# Patient Record
Sex: Female | Born: 1995 | Race: Black or African American | Hispanic: No | Marital: Single | Smoking: Never smoker
Health system: Southern US, Community
[De-identification: ages and names within clinical notes are randomized; demographics above are authoritative.]

## PROBLEM LIST (undated history)

## (undated) DIAGNOSIS — T7840XA Allergy, unspecified, initial encounter: Secondary | ICD-10-CM

## (undated) HISTORY — PX: WISDOM TOOTH EXTRACTION: SHX21

## (undated) HISTORY — DX: Allergy, unspecified, initial encounter: T78.40XA

---

## 2015-11-23 ENCOUNTER — Ambulatory Visit (INDEPENDENT_AMBULATORY_CARE_PROVIDER_SITE_OTHER): Payer: BLUE CROSS/BLUE SHIELD | Admitting: Family Medicine

## 2015-11-23 ENCOUNTER — Ambulatory Visit (INDEPENDENT_AMBULATORY_CARE_PROVIDER_SITE_OTHER): Payer: BLUE CROSS/BLUE SHIELD

## 2015-11-23 VITALS — BP 122/80 | HR 100 | Temp 99.2°F | Resp 16 | Ht 64.0 in | Wt 207.4 lb

## 2015-11-23 DIAGNOSIS — M545 Low back pain, unspecified: Secondary | ICD-10-CM

## 2015-11-23 DIAGNOSIS — M542 Cervicalgia: Secondary | ICD-10-CM | POA: Diagnosis not present

## 2015-11-23 DIAGNOSIS — S161XXA Strain of muscle, fascia and tendon at neck level, initial encounter: Secondary | ICD-10-CM

## 2015-11-23 DIAGNOSIS — G44319 Acute post-traumatic headache, not intractable: Secondary | ICD-10-CM

## 2015-11-23 DIAGNOSIS — S39012A Strain of muscle, fascia and tendon of lower back, initial encounter: Secondary | ICD-10-CM

## 2015-11-23 MED ORDER — METHOCARBAMOL 500 MG PO TABS: 500.0000 mg | ORAL_TABLET | Freq: Four times a day (QID) | ORAL | Status: AC | PRN

## 2015-11-23 MED ORDER — IBUPROFEN 600 MG PO TABS: 600.0000 mg | ORAL_TABLET | Freq: Four times a day (QID) | ORAL | Status: AC | PRN

## 2015-11-23 NOTE — Patient Instructions (Signed)
Cervical Sprain  A cervical sprain is an injury in the neck in which the strong, fibrous tissues (ligaments) that connect your neck bones stretch or tear. Cervical sprains can range from mild to severe. Severe cervical sprains can cause the neck vertebrae to be unstable. This can lead to damage of the spinal cord and can result in serious nervous system problems. The amount of time it takes for a cervical sprain to get better depends on the cause and extent of the injury. Most cervical sprains heal in 1 to 3 weeks.  CAUSES   Severe cervical sprains may be caused by:    Contact sport injuries (such as from football, rugby, wrestling, hockey, auto racing, gymnastics, diving, martial arts, or boxing).    Motor vehicle collisions.    Whiplash injuries. This is an injury from a sudden forward and backward whipping movement of the head and neck.   Falls.   Mild cervical sprains may be caused by:    Being in an awkward position, such as while cradling a telephone between your ear and shoulder.    Sitting in a chair that does not offer proper support.    Working at a poorly designed computer station.    Looking up or down for long periods of time.   SYMPTOMS    Pain, soreness, stiffness, or a burning sensation in the front, back, or sides of the neck. This discomfort may develop immediately after the injury or slowly, 24 hours or more after the injury.    Pain or tenderness directly in the middle of the back of the neck.    Shoulder or upper back pain.    Limited ability to move the neck.    Headache.    Dizziness.    Weakness, numbness, or tingling in the hands or arms.    Muscle spasms.    Difficulty swallowing or chewing.    Tenderness and swelling of the neck.   DIAGNOSIS   Most of the time your health care provider can diagnose a cervical sprain by taking your history and doing a physical exam. Your health care provider will ask about previous neck injuries and any known neck  problems, such as arthritis in the neck. X-rays may be taken to find out if there are any other problems, such as with the bones of the neck. Other tests, such as a CT scan or MRI, may also be needed.   TREATMENT   Treatment depends on the severity of the cervical sprain. Mild sprains can be treated with rest, keeping the neck in place (immobilization), and pain medicines. Severe cervical sprains are immediately immobilized. Further treatment is done to help with pain, muscle spasms, and other symptoms and may include:   Medicines, such as pain relievers, numbing medicines, or muscle relaxants.    Physical therapy. This may involve stretching exercises, strengthening exercises, and posture training. Exercises and improved posture can help stabilize the neck, strengthen muscles, and help stop symptoms from returning.   HOME CARE INSTRUCTIONS    Put ice on the injured area.     Put ice in a plastic bag.     Place a towel between your skin and the bag.     Leave the ice on for 15-20 minutes, 3-4 times a day.    If your injury was severe, you may have been given a cervical collar to wear. A cervical collar is a two-piece collar designed to keep your neck from moving while it heals.      Do not remove the collar unless instructed by your health care provider.    If you have long hair, keep it outside of the collar.    Ask your health care provider before making any adjustments to your collar. Minor adjustments may be required over time to improve comfort and reduce pressure on your chin or on the back of your head.    Ifyou are allowed to remove the collar for cleaning or bathing, follow your health care provider's instructions on how to do so safely.    Keep your collar clean by wiping it with mild soap and water and drying it completely. If the collar you have been given includes removable pads, remove them every 1-2 days and hand wash them with soap and water. Allow them to air dry. They should be completely  dry before you wear them in the collar.    If you are allowed to remove the collar for cleaning and bathing, wash and dry the skin of your neck. Check your skin for irritation or sores. If you see any, tell your health care provider.    Do not drive while wearing the collar.    Only take over-the-counter or prescription medicines for pain, discomfort, or fever as directed by your health care provider.    Keep all follow-up appointments as directed by your health care provider.    Keep all physical therapy appointments as directed by your health care provider.    Make any needed adjustments to your workstation to promote good posture.    Avoid positions and activities that make your symptoms worse.    Warm up and stretch before being active to help prevent problems.   SEEK MEDICAL CARE IF:    Your pain is not controlled with medicine.    You are unable to decrease your pain medicine over time as planned.    Your activity level is not improving as expected.   SEEK IMMEDIATE MEDICAL CARE IF:    You develop any bleeding.   You develop stomach upset.   You have signs of an allergic reaction to your medicine.    Your symptoms get worse.    You develop new, unexplained symptoms.    You have numbness, tingling, weakness, or paralysis in any part of your body.   MAKE SURE YOU:    Understand these instructions.   Will watch your condition.   Will get help right away if you are not doing well or get worse.     This information is not intended to replace advice given to you by your health care provider. Make sure you discuss any questions you have with your health care provider.     Document Released: 10/07/2007 Document Revised: 12/15/2013 Document Reviewed: 06/17/2013  [employer]vier Interactive Patient Education 2016 [employer]
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
## 2015-11-29 ENCOUNTER — Telehealth: Payer: Self-pay

## 2015-11-29 DIAGNOSIS — S161XXD Strain of muscle, fascia and tendon at neck level, subsequent encounter: Secondary | ICD-10-CM

## 2015-11-29 DIAGNOSIS — S39012D Strain of muscle, fascia and tendon of lower back, subsequent encounter: Secondary | ICD-10-CM

## 2015-11-29 NOTE — Telephone Encounter (Signed)
SMITH - Pt would like discuss with you her need for physical therapy.  [phone]

## 2015-11-30 NOTE — Telephone Encounter (Signed)
Call --- 1. Placed referral for physical therapy.  2.  Please get update --- how is neck pain and how is lower back pain and how is headache?  Is she better/worse/unchanged?  Any new symptoms?

## 2015-11-30 NOTE — Telephone Encounter (Signed)
Attempted to call patient back. Patient did not answer the phone and her voicemail was full. Will call back later.

## 2015-11-30 NOTE — Telephone Encounter (Signed)
Patient called back. She stated that she would like to start physical therapy as mentioned during her last office visit. If possible, she would like to go [place] since [place] is returning there this Saturday for school. She stated that she will be there for a month.   Best call back number is [phone]

## 2015-12-01 NOTE — Telephone Encounter (Signed)
Dr. Katrinka BlazingSmith  I spoke with Pt. The neck pain is still somewhat there, but mainly her issue is the back pain. She says it is difficult to pick things up that are a little heavy. Also that it is hard to find a comfortable position to sleep in. As far as her headache goes it is off and on and the pain level remains unchanged. No new symptoms at this time.

## 2015-12-01 NOTE — Telephone Encounter (Signed)
Noted  
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
## 2017-04-05 DIAGNOSIS — H6120 Impacted cerumen, unspecified ear: Secondary | ICD-10-CM | POA: Insufficient documentation

## 2017-04-05 DIAGNOSIS — J309 Allergic rhinitis, unspecified: Secondary | ICD-10-CM | POA: Insufficient documentation

## 2017-04-05 DIAGNOSIS — E669 Obesity, unspecified: Secondary | ICD-10-CM | POA: Insufficient documentation

## 2020-11-09 DIAGNOSIS — E876 Hypokalemia: Secondary | ICD-10-CM | POA: Insufficient documentation

## 2020-11-09 DIAGNOSIS — A4150 Gram-negative sepsis, unspecified: Secondary | ICD-10-CM

## 2020-11-09 DIAGNOSIS — N10 Acute tubulo-interstitial nephritis: Secondary | ICD-10-CM

## 2020-11-09 DIAGNOSIS — R739 Hyperglycemia, unspecified: Secondary | ICD-10-CM | POA: Insufficient documentation

## 2020-11-09 HISTORY — DX: Gram-negative sepsis, unspecified: A41.50

## 2020-11-09 HISTORY — DX: Acute pyelonephritis: N10

## 2020-11-10 DIAGNOSIS — D649 Anemia, unspecified: Secondary | ICD-10-CM | POA: Insufficient documentation

## 2021-01-23 ENCOUNTER — Encounter (INDEPENDENT_AMBULATORY_CARE_PROVIDER_SITE_OTHER): Payer: Self-pay | Admitting: Family Medicine

## 2021-01-23 ENCOUNTER — Ambulatory Visit (INDEPENDENT_AMBULATORY_CARE_PROVIDER_SITE_OTHER): Payer: No Typology Code available for payment source | Admitting: Family Medicine

## 2021-01-23 VITALS — BP 126/80 | HR 72 | Temp 98.6°F | Resp 12 | Ht 65.0 in | Wt 205.0 lb

## 2021-01-23 DIAGNOSIS — Z6834 Body mass index (BMI) 34.0-34.9, adult: Secondary | ICD-10-CM | POA: Insufficient documentation

## 2021-01-23 DIAGNOSIS — Z0001 Encounter for general adult medical examination with abnormal findings: Secondary | ICD-10-CM

## 2021-01-23 DIAGNOSIS — N920 Excessive and frequent menstruation with regular cycle: Secondary | ICD-10-CM

## 2021-01-23 DIAGNOSIS — Z8616 Personal history of COVID-19: Secondary | ICD-10-CM | POA: Insufficient documentation

## 2021-01-23 DIAGNOSIS — E6609 Other obesity due to excess calories: Secondary | ICD-10-CM

## 2021-01-23 DIAGNOSIS — Z6837 Body mass index (BMI) 37.0-37.9, adult: Secondary | ICD-10-CM | POA: Insufficient documentation

## 2021-01-23 DIAGNOSIS — D500 Iron deficiency anemia secondary to blood loss (chronic): Secondary | ICD-10-CM

## 2021-01-23 LAB — HEMOGLOBIN A1C
Average Estimated Glucose: 102.5 mg/dL
Hemoglobin A1C: 5.2 % (ref 4.6–5.9)

## 2021-01-23 LAB — HEMOLYSIS INDEX: Hemolysis Index: 2 (ref 0–24)

## 2021-01-23 LAB — COMPREHENSIVE METABOLIC PANEL
ALT: 13 U/L (ref 0–55)
AST (SGOT): 16 U/L (ref 5–34)
Albumin/Globulin Ratio: 1.1 (ref 0.9–2.2)
Albumin: 4.3 g/dL (ref 3.5–5.0)
Alkaline Phosphatase: 37 U/L (ref 37–117)
Anion Gap: 7 (ref 5.0–15.0)
BUN: 9 mg/dL (ref 7.0–19.0)
Bilirubin, Total: 0.5 mg/dL (ref 0.2–1.2)
CO2: 26 mEq/L (ref 21–29)
Calcium: 9.5 mg/dL (ref 8.5–10.5)
Chloride: 105 mEq/L (ref 100–111)
Creatinine: 0.7 mg/dL (ref 0.4–1.5)
Globulin: 3.8 g/dL — ABNORMAL HIGH (ref 2.0–3.7)
Glucose: 82 mg/dL (ref 70–100)
Potassium: 4.1 mEq/L (ref 3.5–5.1)
Protein, Total: 8.1 g/dL (ref 6.0–8.3)
Sodium: 138 mEq/L (ref 136–145)

## 2021-01-23 LAB — CBC AND DIFFERENTIAL
Absolute NRBC: 0 10*3/uL (ref 0.00–0.00)
Basophils Absolute Automated: 0.03 10*3/uL (ref 0.00–0.08)
Basophils Automated: 0.8 %
Eosinophils Absolute Automated: 0.01 10*3/uL (ref 0.00–0.44)
Eosinophils Automated: 0.3 %
Hematocrit: 40.9 % (ref 34.7–43.7)
Hgb: 12.6 g/dL (ref 11.4–14.8)
Immature Granulocytes Absolute: 0 10*3/uL (ref 0.00–0.07)
Immature Granulocytes: 0 %
Lymphocytes Absolute Automated: 1.15 10*3/uL (ref 0.42–3.22)
Lymphocytes Automated: 29 %
MCH: 27.3 pg (ref 25.1–33.5)
MCHC: 30.8 g/dL — ABNORMAL LOW (ref 31.5–35.8)
MCV: 88.7 fL (ref 78.0–96.0)
MPV: 11.8 fL (ref 8.9–12.5)
Monocytes Absolute Automated: 0.28 10*3/uL (ref 0.21–0.85)
Monocytes: 7.1 %
Neutrophils Absolute: 2.49 10*3/uL (ref 1.10–6.33)
Neutrophils: 62.8 %
Nucleated RBC: 0 /100 WBC (ref 0.0–0.0)
Platelets: 179 10*3/uL (ref 142–346)
RBC: 4.61 10*6/uL (ref 3.90–5.10)
RDW: 15 % (ref 11–15)
WBC: 3.96 10*3/uL (ref 3.10–9.50)

## 2021-01-23 LAB — LIPID PANEL
Cholesterol / HDL Ratio: 3.5
Cholesterol: 149 mg/dL (ref 0–199)
HDL: 42 mg/dL (ref 40–9999)
LDL Calculated: 97 mg/dL (ref 0–99)
Triglycerides: 51 mg/dL (ref 34–149)
VLDL Calculated: 10 mg/dL (ref 10–40)

## 2021-01-23 LAB — GFR: EGFR: >60

## 2021-01-23 LAB — HIV-1/2 AG/AB 4TH GEN. W/ REFLEX: HIV Ag/Ab, 4th Generation: NONREACTIVE

## 2021-01-23 LAB — TSH: TSH: 1.38 u[IU]/mL (ref 0.35–4.94)

## 2021-01-23 NOTE — Progress Notes (Signed)
[facility]                         Date of Exam: 01/23/2021 3:44 PM        Patient ID: R[facility]ael [facility] is a 25 y.o. female.  Attending Physician: Satira Mccallum, MD        Chief Complaint:    Wellness exam            HPI:    HPI  Visit Type: Health Maintenance Visit  Work Status: working full-time  Reported Health: good health  Reported Diet: moderate compliance with well-balanced diet  Reported Exercise: very active at work, regularly, 15-30 minutes/day and walking  Dental: regular dental visits twice a year  Vision: glasses and regular eye exams   Hearing: normal hearing  Immunization Status: immunizations up to date  Reproductive Health: sexually active  Prior Screening Tests: no previous colorectal cancer screening, pap smear within past 3-5 years, no previous mammogram and no previous dexa scan  General Health Risks: no family history of prostate cancer, no family history of colon cancer and no family history of breast cancer  Safety Elements Used: uses seat belts, smoke detectors in household, carbon monoxide detectors in household, sunscreen use, does not text and drive and no guns at home   1. How often do you have a drink containing alcohol?  (1) Monthly or less    2. How many drinks containing alcohol do you have on a typical day when you are drinking?  (0) 1-2    3. How often do you have six or more drinks on one occasion?  (0) Never    Score: Sum of 1 + 2 + 3 = 1    Scoring:     Total Score Risk Level Intervention Suggested   Men Women     0-3 0-2 Minimal Risk Brief Education   4-5 3-4 Low Risk  Review Drinking Behavior  Complete AUDIT  Brief Education and Brief Intervention   6-9 5-8 Moderate Risk Review Drinking Behavior   Complete AUDIT  Provide Resources (pamphlet or website)   Follow Up appointment   10-12 9-12 High Risk Review Drinking Behavior  Complete AUDIT  Brief Education and Counseilng  Provide Resources (Pamphlet or website)  Follow up to review use  Send to  Specialist     1. Iron def anemia: took iron daily for a while and stopped  lmp 2 q wks , last for  Up to 7 days and very heavy. q month  No blood in urine or stool  Currently sexually active, use condoms          Problem List:    Patient Active Problem List   Diagnosis    Anemia    Hyperglycemia    Hypokalemia    Personal history of COVID-19    Class 1 obesity due to excess calories without serious comorbidity with body mass index (BMI) of 34.0 to 34.9 in adult             Current Meds:    Outpatient Medications Marked as Taking for the 01/23/21 encounter (Office Visit) with Satira Mccallum, MD   Medication Sig Dispense Refill    ferrous sulfate 325 (65 FE) MG tablet Take 325 mg by mouth      Lactobacillus Acid-Pectin (Acidophilus/Pectin) Cap Take 1 capsule by mouth            Allergies:    Allergies  Allergen Reactions    Pollen Extract     Red Dye Nausea Only             Past Surgical History:    Past Surgical History:   Procedure Laterality Date    WISDOM TOOTH EXTRACTION             Family History:    Family History   Problem Relation Age of Onset    Diabetes Mother     Lung cancer Father     Brain cancer Father     Hypertension Neg Hx     Heart disease Neg Hx     Stroke Neg Hx            Social History:    Social History     Tobacco Use    Smoking status: Never Smoker    Smokeless tobacco: Never Used   Haematologist Use: Never used   Substance Use Topics    Alcohol use: Yes     Comment: once a week 2-3 glasses    Drug use: Never           The following sections were reviewed this encounter by the provider:   Tobacco   Allergies   Meds   Problems   Med Hx   Surg Hx   Fam Hx              Vital Signs:    BP 126/80 (BP Site: Left arm, Patient Position: Sitting, Cuff Size: Large)    Pulse 72    Temp 98.6 F (37 C) (Temporal)    Resp 12    Ht 1.651 m (5\' 5" )    Wt 93 kg (205 lb)    LMP 01/07/2021    BMI 34.11 kg/m          ROS:    Review of Systems   General/Constitutional:   Denies Fatigue.  Wt fluctuate  Ophthalmologic:   Denies Blurred vision  ENT: feeling fine  Endocrine:   Denies Weakness, normal  Respiratory:    Denies Shortness of breath. Denies cough  Cardiovascular:   Denies Chest pain.   Gastrointestinal:   Denies Abdominal pain. Bowels are moving fine.   Hematology: Normal  Genitourinary:   Denies urinary problems.  Musculoskeletal:   Moving well  Skin:   Denies Change in Mole(s). Denies Rash.   Neurologic: Normal   Psychiatric:   Denies Anxiety. Denies Depressed mood. Denies Difficulty sleeping.           Physical Exam:       GENERAL APPEARANCE: alert, in no acute distress, well developed, well nourished,  obese  HEAD: normal appearance, atraumatic.   EYES: extraocular movement intact (EOMI), pupils equal, round, reactive to light and accommodation, sclera anicteric, conjunctiva clear.   EARS: tympanic membranes normal bilaterally, external canals normal .   NOSE: normal nasal mucosa, no lesions.   ORAL CAVITY: normal oropharynx, normal lips, mucosa moist, no lesions.   THROAT: normal appearance, clear, no erythema.   NECK/THYROID: neck supple, no cervical lymphadenopathy, no neck mass palpated, no jugular venous distention, no thyromegaly.   LYMPH NODES: no palpable adenopathy.   SKIN: good turgor, no rashes, no suspicious lesions.   HEART: S1, S2 normal, no murmurs, rubs, gallops, regular rate and rhythm.   LUNGS: normal effort / no distress, normal breath sounds, clear to auscultation bilaterally, no wheezes, rales, rhonchi.   BREASTS: no discharge, no masses palpable bilaterally, nontender.  ABDOMEN: bowel sounds present, no hepatosplenomegaly, soft, nontender, nondistended.   FEMALE GENITOURINARY: adnexa nontender, bimanual exam, no masses, cervix without lesions, nontender, labia without lesions or masses, vaginal mucosa pink, no lesions. Thick whitish d/c  MUSCULOSKELETAL: full range of motion, no swelling or deformity.   EXTREMITIES: no edema, no clubbing, cyanosis, or edema.    PERIPHERAL PULSES:normal  NEUROLOGIC: nonfocal, cranial nerves 2-12 grossly intact, symmetrical, normal strength, tone and reflexes, sensory exam intact. No facial asymmetry  PSYCH: cognitive function intact, mood/affect normal, speech clear. Memory intact. Normal thought process.        Assessment:    1. Encounter for routine adult health examination with abnormal findings  - CBC and differential  - Comprehensive metabolic panel  - TSH  - Lipid panel  - Hemoglobin A1C  - PAP SMEAR, THINPREP REFLEX HPV MRNA E6/E7, CT/NG  - HIV-1/2 Ag/Ab 4th Gen. w/ Reflex  - US Pelvis with Transvaginal    2. Personal history of COVID-19    3. Class 1 obesity due to excess calories without serious comorbidity with body mass index (BMI) of 34.0 to 34.9 in adult    4. Menorrhagia with regular cycle    5. Iron deficiency anemia due to chronic blood loss  - Ferritin  - IRON PROFILE            Plan:    Lifestyle discussed: Healthy diet rich in fruits and vegetables, whole grains and lean meats, scarce in sugars, carbs, processed foods.   Exercise 30 minutes at least five days a week. (total of 150 minutes per week).    Wear Sunscreen in the sun, helmets on every bike ride and seat belts for every car ride.   Age appropriate screening tests discussed and recommended    Get shot records  Recommend mirena IUD t help periods and for birth control    Health Maintenance Reminders:  PREVENTION  Exercise  Aerobic exercise(walking, bike riding, swimming, runnin, aerobics) has been proven to reduce stress, help with sleep and reducethe risk of heart disease, diabetes, stroke, hyertension, cancer (specifically colon and breast cancer)and greatly reduce overall mortality. Experts recommend at least 30 minutes of aerobic exercise most days or 150 minutes of exercise per week. In addition, two sessions per week of weight -bearing exercise(strength or resistance exercises)can reduce the risk of osteoporosis(brittle bones) and improve strength and  fitness. Now SITTING IS THE NEW SMOKING. So if you have a sedentary job, you may need to exercise up to one hour a day to make up for it.    Smoking Cessation  If you use tobacco, either smoking or dipping/chewing, vaping, quitting would significantly reduce your risk of heart disease, stroke, hypertension, chronic bronchitis, emphysema, asthma and cancer(especially lung, throat, bladder and cervical). Smoking while pregnant can cause complications such as low birth weight. Children in smoking households are at a higher risk of Sudden Infant Death Syndrome(SIDS). We can offer information, advice, techniques and medications if you are interested in quitting.     Healthy Diet  Scientific studies have repeatedly shown the importance of a  Healthy diet in providing protection from heart disease, stroke, hypertension and many cancers. Reduction in red meats and fatty foods can help control cholesterol. Five to nine servings per day of fruits and vegetables have been shown to reduce the risk of heart disease and certain cancers including colon, breast and prostate. Diets high in natural fiber have been associated with lower rates of heart disease and colon cancer. Eating  fish high in Omega 3 fatty acids(salmon, tuna) may reduce risk of heart disease. A healthy  diet is essential in any successful weight loss program.     Alcohol  Alcohol is a toxin, a cancer causing agent. It is best not to drink at all. If you drink, alcohol should be used in moderation. Men should have no more than two drinks per day and women no more than one drink per day. Many studies have shown that drinking more than two drinks per day on a regular basis can increase risk of cancer and liver disease. Drinking alcohol while pregnant can cause fetal alcohol syndrome in infants, which could result in a physical or neurological problem.    Calcium and Vitamin D  Calcium is essential to develop and maintain strong bones. Current studies recommend  calcium is best taken in foods. Take calcium pill supplements only if you do not get from foods since they can have side effects too. Vitamin D is needed to absorb calcium. Adults need at least 1000 iu of Vitamin D daily. Since Vitamin D is not available in foods easily, it can be taken as Vitamin D3 supplement. Also it can be formed in your body by 20 minutes of sun exposure  to 20% of your skin every day.  The following foods each contain about 300 mg of calcium:  2 oz. Cheese  8 oz. Milk or yoghurt  1 cup cooked broccoli, spinach, collards or kale    1/4 cup or a handful of almonds has 80 mg of calcium    The following amounts of calcium are recommended:  1000 mg per day for men and pre-menopausal women  1200 mg per day for pregnant and nursing women  1500 mg per day for post-menopausal women and those with osteoporosis      Folic Acid  [phone] mcg(micrograms)daily of folic acid is recommended for all women of childbearing age to reduce risk of birth defects.    Multivitamin  I recommend one [place] made multivitamin daily as long as tolerated. Best way to get vitamins is rainbow color of vegetables and fruit. So a multivitamin should not replace a healthy diet, rather just augment it.    Eye Exam  Examination of eyes by a professional should be done every one to two years. If you wear glasses or contact lenses see optometrist every year.  Glaucoma screening is done every 2 years starting at age 65.    Dental Exam  Preventive dental exam should be done every six months.    CANCER SCREENING TESTS:  Breast Cancer: Mammograms are recommended every one to two years after age 4. Women between 21 and 43 should discuss the need for routine mammogram with their physician.    Cervical Cancer:Pap smears should be done on a regular basis in women who still have a uterus and are between the ages of 61 and 50. After hysterectomy, paps are not needed if there is no cervix. According to current guidelines, if feeling fine,  routine pelvic exams are not indicated.    Colon Cancer: Everyone between the ages of 52 and 32 should have periodic screening tests for colon cancer. Following methods are available:          Colonoscopy every 10 years is the gold standard        FIT test (stool exam) every year        Cologard(stool exam) every 3 years    Skin Cancer:Risk of skin cancer may  be reduced by use of sun screen(at least SPF 30)when outdoors, using hats or clothing to  Minimize sun exposure to the skin, avoiding tanning booths and avoiding sun exposure during peak sun times(11 AM to 3 PM).This will also prevent premature aging and wrinkling of the skin.   Monitor moles and check your skin once a month looking for the following features, using formula ABCD:        A: asymmetry of lesions, moles        B:irregular borders, blood vessels in moles        C:different colors in one mole        D:diameter increasing, changing, itching and bothering. Specially if 6 mm or larger.    Prostate Cancer: Ask your doctor/provider about the possible risks and benefits of screening for prostate cancer. Guidelines have changed. It can be discussed starting at age 49 for men with average risk. It can be discussed starting at age 34 for men at high risk and  who have one first degree relative with prostate cancer at an early age. It can be discussed at  age 69 for men of African American descent and men who have a first-degree relative(father or brother) diagnosed with prostate cancer at an early age(younger than age 64).    SAFETY:  Seat Belts should be used by everyone in the car. Children under 12 are safest in the back seat. Age appropriate car seats should be installed and used consistently. Car seats are required for all children under age six. Seats should be rear facing for infants less than 20 lbs and may face forward for those between 20-40 lbs. Booster seats should be used until a child is over 4 feet 9 inches tall.    Smoke Detectors should be  on all floors of the house, have battery backup if connected to the electrical system and should have the batteries replaced every six months( when you change the clocks in Spring and Fall).    Helmets and other protective gear should be used for biking, tricycling, skateboarding and roller blading. Helmets and full protective clothing should always be worn while riding motorcycles.    Guns should be stored in a secure locked location, separate from the ammunition. They are best stored unloaded and with safety locks.    Immunizations should be checked and updated regularly. Ask your doctor if you have any questions.    Plan visit for weight loss        Follow-up:    Return in about 1 year (around 01/23/2022) for wellness PE, fasting, office visit, visit.         Satira Mccallum, MD

## 2021-01-24 LAB — HEMOLYSIS INDEX: Hemolysis Index: 2 (ref 0–24)

## 2021-01-24 LAB — IRON PROFILE
Iron Saturation: 10 % — ABNORMAL LOW (ref 15–50)
Iron: 38 ug/dL — ABNORMAL LOW (ref 40–145)
TIBC: 373 ug/dL (ref 265–497)
UIBC: 335 ug/dL (ref 126–382)

## 2021-01-24 NOTE — Progress Notes (Signed)
Hello Rachael Chambers:    I have reviewed your results.    Good news normal cbc and no anemia now  Sugars normal  Normal thyroid, liver, kidneys  Excellent cholesterol  So labs excellent!    I recommend make 30 min visit for weight loss,  ok it virtual too    Take good care and practice healthy habits  Dr. Jonathon Bellows

## 2021-01-25 ENCOUNTER — Other Ambulatory Visit (INDEPENDENT_AMBULATORY_CARE_PROVIDER_SITE_OTHER): Payer: Self-pay | Admitting: Family Medicine

## 2021-01-25 DIAGNOSIS — D649 Anemia, unspecified: Secondary | ICD-10-CM

## 2021-01-25 NOTE — Progress Notes (Signed)
Ferritin order placed in EMR.  Dr Jonathon Bellows wanted Iron TIBC and ferritin added on  Ferritin was not able to be added on and patient needs redraw  Iron and TIBC results are in EMR  Left detailed message for patient (ok per disclosure form) stating to call back and schedule a lab visit to have ferritin blood draw done

## 2021-01-26 NOTE — Progress Notes (Signed)
Hello Ms.  Sturdivant:    I have reviewed your results.    Iron mildy low, no anemia. Take iron tab over the counter one daily and iron rich foods      Take good care and practice healthy habits  Dr. Jonathon Bellows

## 2021-02-01 NOTE — Progress Notes (Signed)
Hello Ms.  Oatley:    I have reviewed your results.    Good news, pelvic Ultrasound normal      Take good care and practice healthy habits  Dr. Jonathon Bellows

## 2021-02-02 ENCOUNTER — Encounter (INDEPENDENT_AMBULATORY_CARE_PROVIDER_SITE_OTHER): Payer: Self-pay | Admitting: Family Medicine

## 2021-02-02 ENCOUNTER — Ambulatory Visit (INDEPENDENT_AMBULATORY_CARE_PROVIDER_SITE_OTHER): Payer: No Typology Code available for payment source | Admitting: Family Medicine

## 2021-02-02 VITALS — BP 114/70 | HR 64 | Temp 97.3°F | Resp 12 | Ht 65.0 in | Wt 201.0 lb

## 2021-02-02 DIAGNOSIS — D500 Iron deficiency anemia secondary to blood loss (chronic): Secondary | ICD-10-CM

## 2021-02-02 DIAGNOSIS — N12 Tubulo-interstitial nephritis, not specified as acute or chronic: Secondary | ICD-10-CM

## 2021-02-02 NOTE — Progress Notes (Signed)
Subjective:      Patient ID: Rachael Chambers is a 25 y.o. female.    Chief Complaint:  Chief Complaint   Patient presents with    form completion       HPI:    25 yo F presents for form completion  She was in ER for UTI and had to take time off work  She has form that employer requires to have filled out for time off for medical leave  She brings records from hospital , see scanned.  She was off from work from 11/17 to 11/21/20  Records verify.    Problem List:  Patient Active Problem List   Diagnosis    Anemia    Hyperglycemia    Hypokalemia    Personal history of COVID-19    Class 1 obesity due to excess calories without serious comorbidity with body mass index (BMI) of 34.0 to 34.9 in adult       Current Medications:  Current Outpatient Medications   Medication Sig Dispense Refill    ferrous sulfate 325 (65 FE) MG tablet Take 325 mg by mouth      Lactobacillus Acid-Pectin (Acidophilus/Pectin) Cap Take 1 capsule by mouth       No current facility-administered medications for this visit.       Allergies:  Allergies   Allergen Reactions    Pollen Extract     Red Dye Nausea Only       Past Medical History:  Past Medical History:   Diagnosis Date    Acute pyelonephritis 11/09/2020    Sepsis due to Gram negative bacteria 11/09/2020       Past Surgical History:  Past Surgical History:   Procedure Laterality Date    WISDOM TOOTH EXTRACTION         Family History:  Family History   Problem Relation Age of Onset    Diabetes Mother     Lung cancer Father     Brain cancer Father     Hypertension Neg Hx     Heart disease Neg Hx     Stroke Neg Hx        Social History:  Social History     Socioeconomic History    Marital status: Single   Occupational History    Occupation: full  time   Tobacco Use    Smoking status: Never Smoker    Smokeless tobacco: Never Used   Haematologist Use: Never used   Substance and Sexual Activity    Alcohol use: Yes     Comment: once a week 2-3 glasses    Drug use:  Never    Sexual activity: Yes     Partners: Male     Birth control/protection: Condom   Social History Narrative    Live with mom and step dad        No siblings        Full time , move around        Exercise:  Walk , strength 3xwk        Stress: up and down, mid        Gyn hx: last in 2019        The following sections were reviewed this encounter by the provider:   Tobacco   Allergies   Meds   Problems   Med Hx   Surg Hx   Fam Hx          ROS:  Review of Systems    Vitals:  BP 114/70 (BP Site: Left arm, Patient Position: Sitting, Cuff Size: Large)    Pulse 64    Temp 97.3 F (36.3 C) (Tympanic)    Resp 12    Ht 1.651 m (5\' 5" )    Wt 91.2 kg (201 lb)    LMP 01/07/2021    BMI 33.45 kg/m      Objective:     Physical Exam:  Physical Exam  Constitutional:       General: She is not in acute distress.  HENT:      Head: Atraumatic.   Cardiovascular:      Rate and Rhythm: Regular rhythm.      Heart sounds: No murmur heard.      Pulmonary:      Effort: Pulmonary effort is normal.      Breath sounds: Normal breath sounds. No wheezing.   Musculoskeletal:         General: No swelling or tenderness.      Cervical back: Neck supple.   Neurological:      General: No focal deficit present.   Psychiatric:         Behavior: Behavior normal.          Assessment:     1. Pyelonephritis    2. Iron deficiency anemia due to chronic blood loss      Plan:     Reviewed ER /hospital records.   Form filled see scanned.    mirena IUD recommended for BP and control periods. Info given    Satira Mccallum, MD

## 2021-02-03 ENCOUNTER — Encounter (INDEPENDENT_AMBULATORY_CARE_PROVIDER_SITE_OTHER): Payer: Self-pay | Admitting: Family Medicine

## 2021-02-07 LAB — PAP SMEAR, THINPREP REFLEX HPV MRNA E6/E7, CT/NG
Chlamydia trachomatis RNA, TMA: NOT DETECTED
N. gonorrhoeae RNA, TMA: NOT DETECTED

## 2021-02-07 NOTE — Progress Notes (Signed)
HI Ms. Dascoli:    Your pap came back normal. Good news!  Screen for gonorrhea and chlamydia negative    Take good care, maximize healthy habits.  Best  Dr. Jonathon Bellows

## 2021-02-15 ENCOUNTER — Encounter (INDEPENDENT_AMBULATORY_CARE_PROVIDER_SITE_OTHER): Payer: Self-pay | Admitting: Family Medicine

## 2021-05-29 ENCOUNTER — Ambulatory Visit (INDEPENDENT_AMBULATORY_CARE_PROVIDER_SITE_OTHER): Payer: No Typology Code available for payment source

## 2021-05-31 ENCOUNTER — Ambulatory Visit (INDEPENDENT_AMBULATORY_CARE_PROVIDER_SITE_OTHER): Payer: No Typology Code available for payment source | Admitting: Nurse Practitioner

## 2021-10-12 ENCOUNTER — Other Ambulatory Visit (INDEPENDENT_AMBULATORY_CARE_PROVIDER_SITE_OTHER): Payer: Self-pay | Admitting: Family Medicine

## 2023-03-26 ENCOUNTER — Encounter (INDEPENDENT_AMBULATORY_CARE_PROVIDER_SITE_OTHER): Payer: Self-pay | Admitting: Legal Medicine

## 2023-03-26 ENCOUNTER — Ambulatory Visit (INDEPENDENT_AMBULATORY_CARE_PROVIDER_SITE_OTHER): Payer: BC Managed Care – PPO | Admitting: Legal Medicine

## 2023-03-26 VITALS — BP 114/78 | HR 88 | Temp 97.7°F | Resp 20 | Ht 64.5 in | Wt 219.2 lb

## 2023-03-26 DIAGNOSIS — Z23 Encounter for immunization: Secondary | ICD-10-CM

## 2023-03-26 DIAGNOSIS — Z1159 Encounter for screening for other viral diseases: Secondary | ICD-10-CM

## 2023-03-26 DIAGNOSIS — Z0000 Encounter for general adult medical examination without abnormal findings: Secondary | ICD-10-CM

## 2023-03-26 DIAGNOSIS — E6609 Other obesity due to excess calories: Secondary | ICD-10-CM

## 2023-03-26 DIAGNOSIS — Z6837 Body mass index (BMI) 37.0-37.9, adult: Secondary | ICD-10-CM

## 2023-03-26 DIAGNOSIS — H9312 Tinnitus, left ear: Secondary | ICD-10-CM

## 2023-03-26 LAB — CBC
Absolute NRBC: 0 10*3/uL (ref 0.00–0.00)
Hematocrit: 34.5 % — ABNORMAL LOW (ref 34.7–43.7)
Hgb: 10.4 g/dL — ABNORMAL LOW (ref 11.4–14.8)
MCH: 24.6 pg — ABNORMAL LOW (ref 25.1–33.5)
MCHC: 30.1 g/dL — ABNORMAL LOW (ref 31.5–35.8)
MCV: 81.8 fL (ref 78.0–96.0)
MPV: 11.7 fL (ref 8.9–12.5)
Nucleated RBC: 0 /100 WBC (ref 0.0–0.0)
Platelets: 237 10*3/uL (ref 142–346)
RBC: 4.22 10*6/uL (ref 3.90–5.10)
RDW: 15 % (ref 11–15)
WBC: 2.78 10*3/uL — ABNORMAL LOW (ref 3.10–9.50)

## 2023-03-26 LAB — COMPREHENSIVE METABOLIC PANEL
ALT: 12 U/L (ref 0–55)
AST (SGOT): 28 U/L (ref 5–41)
Albumin/Globulin Ratio: 1.2 (ref 0.9–2.2)
Albumin: 4.2 g/dL (ref 3.5–5.0)
Alkaline Phosphatase: 41 U/L (ref 37–117)
Anion Gap: 7 (ref 5.0–15.0)
BUN: 8 mg/dL (ref 7.0–21.0)
Bilirubin, Total: 0.7 mg/dL (ref 0.2–1.2)
CO2: 27 mEq/L (ref 17–29)
Calcium: 9.4 mg/dL (ref 8.5–10.5)
Chloride: 105 mEq/L (ref 99–111)
Creatinine: 0.8 mg/dL (ref 0.4–1.0)
Globulin: 3.6 g/dL (ref 2.0–3.6)
Glucose: 83 mg/dL (ref 70–100)
Potassium: 3.8 mEq/L (ref 3.5–5.3)
Protein, Total: 7.8 g/dL (ref 6.0–8.3)
Sodium: 139 mEq/L (ref 135–145)
eGFR: >60 mL/min/{1.73_m2} (ref >=60)

## 2023-03-26 LAB — LIPID PANEL
Cholesterol / HDL Ratio: 4.4 Index
Cholesterol: 169 mg/dL (ref 0–199)
HDL: 38 mg/dL — ABNORMAL LOW (ref 40–9999)
LDL Calculated: 114 mg/dL — ABNORMAL HIGH (ref 0–99)
Triglycerides: 83 mg/dL (ref 34–149)
VLDL Calculated: 17 mg/dL (ref 10–40)

## 2023-03-26 LAB — HEMOGLOBIN A1C
Average Estimated Glucose: 99.7 mg/dL
Hemoglobin A1C: 5.1 % (ref 4.6–5.6)

## 2023-03-26 LAB — HEPATITIS C ANTIBODY, TOTAL: Hepatitis C, AB: NONREACTIVE

## 2023-03-26 LAB — HEMOLYSIS INDEX(SOFT): Hemolysis Index: 1 Index (ref 0–24)

## 2023-03-26 NOTE — Progress Notes (Signed)
[place]                       Date of Exam: 03/26/2023 11:58 AM        Patient ID: Rachael Chambers is a 27 y.o. female.  Attending Physician: Carloyn Manner, MD        Chief Complaint:    Chief Complaint   Patient presents with    Annual Exam     WWE               HPI:    HPI  Visit Type: Health Maintenance Visit - fasting. Lives with mom  Work Status: working full-time, Careers information officer   Reported Health: good health  Reported Diet: "working on improving". No soda   Reported Exercise: "working on improving to 5x/week"  Dental: dentist visit within 6-12 months. 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  Reproductive Health: sexually active - LMP 03/08/23, "heavy usually lasting approx 7 days"   Uses condoms. Female partner . G0  Prior Screening Tests:  last pap normal on 01/23/21 normal   General Health Risks: no family history of colon cancer and family history of breast cancer  Safety Elements Used: uses seat belts, smoke detectors in household, carbon monoxide detectors in household, sunscreen use, does not text and drive, and no guns at home  Smoking: Never. No nicotine  Alcohol: Yes "on occasions".  Once a week 1-3 drinks  Drugs:No   PHQ: 0    1. How often do you have a drink containing alcohol?  (1) Monthly or less    2. How many drinks containing alcohol do you have on a typical day when you are drinking?  (1) 3-4    3. How often do you have six or more drinks on one occasion?  (0) Never    Score: Sum of 1 + 2 + 3 =     Scoring: 2    Total Score Risk Level Intervention Suggested   Men Women     0-3 0-2 Minimal Risk Brief Education   4-5 3-4 Low Risk  Review Drinking Behavior  Complete AUDIT  Brief Education and Brief Intervention   6-9 5-8 Moderate Risk Review Drinking Behavior   Complete  AUDIT  Provide Resources (pamphlet or website)   Follow Up appointment   10-12 9-12 High Risk Review Drinking Behavior  Complete AUDIT  Brief Education and Counseilng  Provide Resources (Pamphlet or website)  Follow up to review use  Send to Specialist                     Problem List:    Patient Active Problem List   Diagnosis    Anemia    Hyperglycemia    Hypokalemia    Personal history of COVID-19    Class 2 obesity due to excess calories without serious comorbidity with body mass index (BMI) of 37.0 to 37.9 in adult             Current Meds:    No outpatient medications have been marked as taking for the 03/26/23 encounter (Office Visit) with Carloyn Manner, MD.          Allergies:    Allergies  Allergen Reactions    Pollen Extract     Red Dye Nausea Only             Past Surgical History:    Past Surgical History:   Procedure Laterality Date    WISDOM TOOTH EXTRACTION             Family History:    Family History   Problem Relation Age of Onset    Diabetes Mother     Hypertension Mother     Lung cancer Father     Brain cancer Father     Heart disease Neg Hx     Stroke Neg Hx            Social History:    Social History     Tobacco Use    Smoking status: Never    Smokeless tobacco: Never   Vaping Use    Vaping status: Never Used   Substance Use Topics    Alcohol use: Yes     Comment: "1-3 glasses on occasions"    Drug use: Never           The following sections were reviewed this encounter by the provider:   Tobacco  Allergies  Meds  Problems  Med Hx  Surg Hx  Fam Hx             Vital Signs:    BP 114/78 (BP Site: Left arm, Patient Position: Sitting, Cuff Size: Medium)   Pulse 88   Temp 97.7 F (36.5 C)   Resp 20   Ht 1.638 m (5' 4.5")   Wt 99.4 kg (219 lb 3.2 oz)   LMP 03/08/2023 Comment: "heavy usually lasting approx 7 days"  BMI 37.04 kg/m          ROS:    Review of Systems   Constitutional: Negative.    HENT:  Positive for tinnitus (episode of rinning last week for 4 days after exposure to loud  music). Negative for congestion and dental problem.    Eyes: Negative.    Respiratory:  Negative for chest tightness and shortness of breath.    Cardiovascular:  Negative for chest pain.   Gastrointestinal:  Negative for abdominal pain, constipation, diarrhea, nausea and vomiting.   Genitourinary:  Negative for difficulty urinating, dysuria and menstrual problem.   Skin:  Negative for rash.              Physical Exam:    Physical Exam  Constitutional:       Appearance: Normal appearance. She is obese.   HENT:      Head: Normocephalic.      Right Ear: Tympanic membrane, ear canal and external ear normal.      Left Ear: Tympanic membrane, ear canal and external ear normal.      Mouth/Throat:      Mouth: Mucous membranes are moist.      Pharynx: Oropharynx is clear.   Eyes:      Extraocular Movements: Extraocular movements intact.      Conjunctiva/sclera: Conjunctivae normal.      Pupils: Pupils are equal, round, and reactive to light.   Cardiovascular:      Rate and Rhythm: Normal rate and regular rhythm.   Pulmonary:      Effort: Pulmonary effort is normal. No respiratory distress.      Breath sounds: Normal breath sounds.   Abdominal:      General: Abdomen is flat.      Palpations:  Abdomen is soft. There is no mass.      Tenderness: There is no abdominal tenderness. There is no guarding or rebound.   Musculoskeletal:         General: Normal range of motion.      Cervical back: Normal range of motion and neck supple.      Right lower leg: No edema.      Left lower leg: No edema.   Skin:     General: Skin is warm.   Neurological:      General: No focal deficit present.      Mental Status: She is alert and oriented to person, place, and time.      Cranial Nerves: No cranial nerve deficit.      Gait: Gait normal.   Psychiatric:         Mood and Affect: Mood normal.         Behavior: Behavior normal.              Assessment:    1. Well adult exam  - Tdap vaccine greater than or equal to 7yo IM  - Comprehensive metabolic  panel  - Lipid panel  - Hepatitis C (HCV) antibody, Total  - Hemoglobin A1C  - CBC without differential    2. Class 2 obesity due to excess calories without serious comorbidity with body mass index (BMI) of 37.0 to 37.9 in adult    3. Need for hepatitis C screening test  - Hepatitis C (HCV) antibody, Total    4. Tinnitus of left ear            Plan:    Normal physical.  Exercise 30 minutes for 5 days  Healthy diet , limit carbohydrates , fatty foods ,and processed food  Fruit and vegetables 5 servings per day.  Continue healthy choices and encouraged  Informed about birth control , long term and did not like ocp in past  Age appropriate recommendation  Await labs  Vaccine: States had 2 doses of HPV not sure which age   Tinnitus resolved , avoid loud noises, ear protection  Follow up prn            Follow-up:    No follow-ups on file.         Carloyn Manner, MD

## 2023-04-29 ENCOUNTER — Telehealth (INDEPENDENT_AMBULATORY_CARE_PROVIDER_SITE_OTHER): Payer: BC Managed Care – PPO | Admitting: Family Medicine

## 2023-05-06 ENCOUNTER — Telehealth (INDEPENDENT_AMBULATORY_CARE_PROVIDER_SITE_OTHER): Payer: Self-pay | Admitting: Family Medicine

## 2023-05-06 ENCOUNTER — Telehealth (INDEPENDENT_AMBULATORY_CARE_PROVIDER_SITE_OTHER): Payer: BC Managed Care – PPO | Admitting: Family Medicine

## 2023-05-06 ENCOUNTER — Encounter (INDEPENDENT_AMBULATORY_CARE_PROVIDER_SITE_OTHER): Payer: Self-pay | Admitting: Family Medicine

## 2023-05-06 VITALS — Ht 64.5 in | Wt 220.0 lb

## 2023-05-06 DIAGNOSIS — Z3009 Encounter for other general counseling and advice on contraception: Secondary | ICD-10-CM

## 2023-05-06 DIAGNOSIS — E6609 Other obesity due to excess calories: Secondary | ICD-10-CM

## 2023-05-06 DIAGNOSIS — Z6837 Body mass index (BMI) 37.0-37.9, adult: Secondary | ICD-10-CM

## 2023-05-06 DIAGNOSIS — N946 Dysmenorrhea, unspecified: Secondary | ICD-10-CM

## 2023-05-06 DIAGNOSIS — N920 Excessive and frequent menstruation with regular cycle: Secondary | ICD-10-CM

## 2023-05-06 NOTE — Progress Notes (Signed)
[facility] - [place]                       Date of Virtual Visit: 05/06/2023 3:07 PM        Patient ID: Rachael Chambers is a 27 y.o. female.  Attending Physician: Satira Mccallum, MD       Telemedicine Eligibility:    State Location:  [x]  [place]  []  [place]  []  [place] []  [place]  []  Other:    Physical Location:  [x]  Home  []         []        []          []  Other:    Patient Identity Verification:  [x]  State Issued ID  []  Insurance Eligibility Check  []  Other:    Physical Address Verification: (for 911)  [x]  Yes  []  No    Personal identity shared with patient:  [x]  Yes  []  No    Education on nature of video visit shared with patient:  [x]  Yes  []  No    Emergency plan agreed upon with patient:  [x]  Yes  []  No    If the patient had not had this virtual visit, what would they have done?  []         []         []        []          []  Other:    Visit terminated since not appropriate for virtual care:  [x]  N/A  []  Reason:         Chief Complaint:    Chief Complaint   Patient presents with    Weight Management    Contraception               HPI:    27 YO Female  Consents to VV and billing     Presents for weight loss management and birthcontrol    Weight management -  Current therapy: not currently on weight loss medication  Patient not currently on weight loss mediation  Diet -   Exercise - 3-5 x week, walking, cardio, strength training  Is interested in exercise program and nutritional counseling    Obesity;  Work evening shift  8:30-9 am  Drink water  Banana  Brkfast 9:15 am: banana  Oj or carrot  Lunch 1-3pm  Biggest meal  pastA,   Fried food trying  Eat out a lot    Snack: cashews, plantain chips  Cheese, Dentist after 8  Eat out home too  Go to bed at 12:30 -1 am      Birth control -  Would like to discuss options  Was on LoEstrin in the past. Had HA's and mood swings while on it    Labs review:  Dyslipidemia: hdl 38    Anemia: mild on 12/29/1094    Sexually active  with female, monogamous    Period 7 days, heavy bad cramps            Problem List:    Patient Active Problem List   Diagnosis    Anemia    Hyperglycemia    Hypokalemia    Personal history of COVID-19    Obesity    Allergic rhinitis    Impacted cerumen             Current Meds:    Outpatient Medications Marked as Taking for the 05/06/23 encounter (  Telemedicine Visit) with Satira Mccallum, MD   Medication Sig Dispense Refill    ferrous sulfate 325 (65 FE) MG tablet Take 1 tablet (325 mg) by mouth      Probiotic Product (PROBIOTIC PO) Take by mouth            Allergies:    Allergies   Allergen Reactions    Pollen Extract     Red Dye Nausea Only and Nausea And Vomiting             Past Surgical History:    Past Surgical History:   Procedure Laterality Date    WISDOM TOOTH EXTRACTION             Family History:    Family History   Problem Relation Age of Onset    Diabetes Mother     Hypertension Mother     Lung cancer Father     Brain cancer Father     Heart disease Neg Hx     Stroke Neg Hx            Social History:    Social History     Socioeconomic History    Marital status: Single   Occupational History    Occupation: full  time   Tobacco Use    Smoking status: Never    Smokeless tobacco: Never   Vaping Use    Vaping status: Never Used   Substance and Sexual Activity    Alcohol use: Yes     Comment: "1-3 glasses on occasions"    Drug use: Never    Sexual activity: Yes     Partners: Male     Birth control/protection: Condom   Social History Narrative    Live with mom and step dad        No siblings        Full time , move around    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 from 2 pm to midnight        Exercise:  Walk , strength 3xwk        Stress: up and down, mid        Gyn hx: last in 2019     Social Determinants of Health     Financial Resource Strain: Low Risk  (11/09/2020)    Received from [facility]    Overall Financial Resource Strain (CARDIA)     Difficulty of Paying Living Expenses: Not very hard   Stress: No Stress  Concern Present (11/09/2020)    Received from [facility] - Occupational Stress Questionnaire     Feeling of Stress : Not at all    Received from [facility]    Social Network    Received from [facility]    HITS                  Vital Signs:    Ht 1.638 m (5' 4.5")   Wt 99.8 kg (220 lb)   LMP 04/22/2023 (Approximate)   BMI 37.18 kg/m          ROS:    Review of Systems           Physical Exam:    Physical Exam  Constitutional:       Appearance: She is obese.        GENERAL APPEARANCE: alert, in no acute distress, pleasant, well nourished.   HEAD: normal appearance  EYES:  no discharge  EARS: normal hearing  NECK/THYROID: appearance -supple  PSYCH: appropriate affect, appropriate mood, normal speech, normal attention        Assessment:    1. Class 2 obesity due to excess calories without serious comorbidity with body mass index (BMI) of 37.0 to 37.9 in adult    2. Menorrhagia with regular cycle    3. Dysmenorrhea    4. General counseling and advice on female contraception            Plan:    I discussed low carb diet in detail to avoid diabetes, heart disease and other obesity related diseases. It helps lose weight, cut down on inflammation and improve energy. I reviewed the allowed food list and foods to avoid or eat less and provided the list.  I advised to aim for 20-30 grams of protein 3xday with 5-10 grams of protein snacks in between. Goal is to provide body with adequate protein to give adequate nutrition and to feel full so that for the extra calories the body needs, it cuts down on its own fat.  I encourage one lb/week weight loss. Check weight at least once a week.  Exercise most days at least 30 minutes.  60 minutes spent counselling and documenting             IUD Risks  including:    irregular or decreased menstrual bleeding with Gretta Cool or Kyleena.  increased risk of infection in the uterus and tubes which could result in infertility or  increased risk of ectopic pregnancy, perforation of the uterus, either with insertion or at some time in the future, requiring surgical removal of the IUD from the abdomen;  expulsion of the IUD, requiring monitoring by checking for the string each month;  spotting between periods;  difficulty removing the IUD in the future;  reviewed with the patient.  If she develops infection, IUD must be removed. Although IUD is approximately 98% effective, if she becomes pregnant with the IUD in place, IUD must be removed. In this setting, miscarriage may happen.    Insertion procedure explained. Difficulties in inserting  in face of cervical stenosis, if the uterus is not deep enough or if the insertion tube does not go in explained. Risks of procedure including bleeding, perforation and infection reviewed with the patient. She was instructed to take ibuprofen 800mg  prior procedure with food and best time to insert is right after the period.    For her mirena seems best to help period, cut cramps and birth control. May have kyleena and skyla as back up        Follow-up:    Return in about 29 days (around 06/04/2023) for office visit, visit iud insertion.         Satira Mccallum, MD

## 2023-05-06 NOTE — Telephone Encounter (Signed)
Pt needs mirena for [facility] and  for heavy and painful periods. Pls check her insurance for coverage and if ok, help make 30 min visit for iud insertion. Remind 800mg  of ibuprofen with food before appt. TY

## 2023-05-08 NOTE — Telephone Encounter (Signed)
Patient Insurance Plan:[place]   Patient Insurance ID:  Provider Phone # for Eligibility:[id number]    Performing Provider: Satira Mccallum MD NPI: [id number]  Tax ID: [id number]     Group NPI: [id number]   [facility] provider# [id number]      Diagnosis Codes:   Z30.430 - Encounter for insertion of 1 Intrauterine Contraceptive Device  Z30.018 - Encounter for initial prescription of other contraceptive implant    IUD CPT Codes Checked (select all applicable): IUD Insertion: 58300 and Mirena: J7298    I    Are code(s) valid and billable? yes  Co-pay: $0  Deductible: $0  Insurance coinsurance:100%  Patient Responsible Co-Insurance:0%   Is prior authorization required? no       Ins Rep NameKaren Kays  Reference #: [id number]  Notes: COVERED AT 100% NO CO PAY OR DEDUCTIBLE APPLIES, NO AUTH REQUIRED PER REP

## 2023-06-20 ENCOUNTER — Encounter (INDEPENDENT_AMBULATORY_CARE_PROVIDER_SITE_OTHER): Payer: Self-pay | Admitting: Family Medicine

## 2023-06-20 ENCOUNTER — Ambulatory Visit (FREE_STANDING_LABORATORY_FACILITY): Payer: BC Managed Care – PPO | Admitting: Family Medicine

## 2023-06-20 VITALS — BP 120/82 | HR 82 | Temp 97.6°F | Resp 14 | Ht 64.5 in | Wt 220.0 lb

## 2023-06-20 DIAGNOSIS — N920 Excessive and frequent menstruation with regular cycle: Secondary | ICD-10-CM

## 2023-06-20 DIAGNOSIS — N946 Dysmenorrhea, unspecified: Secondary | ICD-10-CM | POA: Insufficient documentation

## 2023-06-20 DIAGNOSIS — Z975 Presence of (intrauterine) contraceptive device: Secondary | ICD-10-CM | POA: Insufficient documentation

## 2023-06-20 DIAGNOSIS — Z30430 Encounter for insertion of intrauterine contraceptive device: Secondary | ICD-10-CM

## 2023-06-20 DIAGNOSIS — Z01419 Encounter for gynecological examination (general) (routine) without abnormal findings: Secondary | ICD-10-CM

## 2023-06-20 DIAGNOSIS — Z113 Encounter for screening for infections with a predominantly sexual mode of transmission: Secondary | ICD-10-CM

## 2023-06-20 LAB — POCT PREGNANCY TEST, URINE HCG: POCT Pregnancy HCG Test, UR: NEGATIVE

## 2023-06-20 MED ORDER — LEVONORGESTREL 20 MCG/DAY IU IUD
1.0000 | INTRAUTERINE_SYSTEM | Freq: Once | INTRAUTERINE | Status: AC
Start: 2023-06-20 — End: ?

## 2023-06-20 NOTE — Procedures (Signed)
IUD Insertion Procedure Note    Pre-operative Diagnosis: dysmenorrh, menorrhg, contraception    Post-operative Diagnosis: normal    Indications: contraception    Procedure Details   Urine pregnancy test was done  and result was negative.  The risks (including infection, bleeding, pain, and uterine perforation) and benefits of the procedure were explained to the patient and Written informed consent was obtained.      Cervix cleansed with Betadine. Uterus sounded to 9 cm. IUD inserted without difficulty. String visible and trimmed. Patient tolerated procedure well.    IUD Information:  Mirena, Lot # [id number], Expiration date 02/2023.    Condition:  Stable    Complications:  None    Plan:    The patient was advised to call for any fever or for prolonged or severe pain or bleeding. She was advised to use OTC acetaminophen as needed for mild to moderate pain.

## 2023-06-20 NOTE — Progress Notes (Signed)
Subjective:      Patient ID: Rachael Chambers is a 27 y.o. female.    Chief Complaint:  Chief Complaint   Patient presents with    Contraception       HPI:  HPI  Here for IUD insertion  Lmp 06/17/2023  Wanting mirena for painful and heavy periods    Problem List:  Patient Active Problem List   Diagnosis    Anemia    Hyperglycemia    Hypokalemia    Personal history of COVID-19    Obesity    Allergic rhinitis    Menorrhagia with regular cycle    Dysmenorrhea    IUD (intrauterine device) in place       Current Medications:  Current Outpatient Medications   Medication Sig Dispense Refill    ferrous sulfate 325 (65 FE) MG tablet Take 1 tablet (325 mg) by mouth      Probiotic Product (PROBIOTIC PO) Take by mouth      Lactobacillus Acid-Pectin (Acidophilus/Pectin) Cap Take 1 capsule by mouth (Patient not taking: Reported on 06/20/2023)       Current Facility-Administered Medications   Medication Dose Route Frequency Provider Last Rate Last Admin    levonorgestrel (MIRENA) 20 MCG/DAY IUD 52 mg  1 each Intrauterine Once Sydne Krahl, MD           Allergies:  Allergies   Allergen Reactions    Pollen Extract     Red Dye Nausea Only and Nausea And Vomiting       Past Medical History:  Past Medical History:   Diagnosis Date    Acute pyelonephritis 11/09/2020    Sepsis due to Gram negative bacteria 11/09/2020       Past Surgical History:  Past Surgical History:   Procedure Laterality Date    WISDOM TOOTH EXTRACTION         Family History:  Family History   Problem Relation Age of Onset    Diabetes Mother     Hypertension Mother     Lung cancer Father     Brain cancer Father     Heart disease Neg Hx     Stroke Neg Hx        Social History:  Social History     Socioeconomic History    Marital status: Single   Occupational History    Occupation: full  time   Tobacco Use    Smoking status: Never    Smokeless tobacco: Never   Vaping Use    Vaping status: Never Used   Substance and Sexual Activity    Alcohol use: Yes     Comment: "1-3 glasses  on occasions"    Drug use: Never    Sexual activity: Yes     Partners: Male     Birth control/protection: Condom   Social History Narrative    Live with mom and step dad        No siblings        Full time , move around    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 from 2 pm to midnight        Exercise:  Walk , strength 3xwk        Stress: up and down, mid        Gyn hx: last in 2019     Social Determinants of Health     Financial Resource Strain: Low Risk  (11/09/2020)    Received from [facility]  Overall Financial Resource Strain (CARDIA)     Difficulty of Paying Living Expenses: Not very hard   Stress: No Stress Concern Present (11/09/2020)    Received from [facility]ational [facility] - Occupational Stress Questionnaire     Feeling of Stress : Not at all    Received from [facility]    Social Network    Received from [facility]    HITS        The following sections were reviewed this encounter by the provider:   Tobacco  Allergies  Meds  Problems  Med Hx  Surg Hx  Fam Hx         ROS:  Review of Systems    Vitals:  BP 120/82   Pulse 82   Temp 97.6 F (36.4 C)   Resp 14   Ht 1.638 m (5' 4.5")   Wt 99.8 kg (220 lb)   LMP 06/18/2023   SpO2 98%   BMI 37.18 kg/m      Objective:     Physical Exam:  Physical Exam  Constitutional:       Appearance: Normal appearance.   Cardiovascular:      Rate and Rhythm: Regular rhythm.      Heart sounds: Normal heart sounds. No murmur heard.  Pulmonary:      Breath sounds: Normal breath sounds. No wheezing.   Abdominal:      Palpations: There is no mass.      Tenderness: There is no abdominal tenderness.   Genitourinary:     General: Normal vulva.      Exam position: Lithotomy position.      Labia:         Right: No tenderness or lesion.         Left: No tenderness or lesion.       Urethra: No urethral swelling or urethral lesion.      Vagina: Normal.      Cervix: No cervical motion tenderness, lesion or erythema.      Uterus: Normal.  Not enlarged and not tender.       Adnexa: Right adnexa normal and left adnexa normal.        Right: No mass.          Left: No mass.     Neurological:      Mental Status: She is alert.          Assessment:     1. Encounter for routine examination for contraception  - POCT Pregnancy Test, Urine HCG; Future  - POCT Pregnancy Test, Urine HCG    2. Screen for STD (sexually transmitted disease)  - Chlamydia trachomatis, Neisseria gonorrhea and Trichomonas vaginalis, PCR; Future  - Chlamydia trachomatis, Neisseria gonorrhea and Trichomonas vaginalis, PCR    3. Encounter for IUD insertion  - levonorgestrel (MIRENA) 20 MCG/DAY IUD 52 mg    4. Dysmenorrhea  - levonorgestrel (MIRENA) 20 MCG/DAY IUD 52 mg    5. Menorrhagia with regular cycle  - levonorgestrel (MIRENA) 20 MCG/DAY IUD 52 mg    6. IUD (intrauterine device) in place      Plan:     See procedure note    Satira Mccallum, MD

## 2023-06-21 LAB — CHLAMYDIA TRACHOMATIS, NEISSERIA GONORRHEA AND TRICHOMONAS VAGINALIS, PCR
Chlamydia trachomatis DNA: NEGATIVE
Neisseria gonorrhoeae DNA: NEGATIVE
Trichomonas vaginalis DNA: NEGATIVE

## 2023-08-06 ENCOUNTER — Encounter (INDEPENDENT_AMBULATORY_CARE_PROVIDER_SITE_OTHER): Payer: Self-pay | Admitting: Family Medicine

## 2023-08-06 ENCOUNTER — Ambulatory Visit (INDEPENDENT_AMBULATORY_CARE_PROVIDER_SITE_OTHER): Payer: BC Managed Care – PPO | Admitting: Family Medicine

## 2023-08-06 VITALS — BP 122/72 | HR 76 | Temp 98.1°F | Resp 12 | Wt 222.2 lb

## 2023-08-06 DIAGNOSIS — Z975 Presence of (intrauterine) contraceptive device: Secondary | ICD-10-CM

## 2023-08-06 DIAGNOSIS — N946 Dysmenorrhea, unspecified: Secondary | ICD-10-CM

## 2023-08-06 DIAGNOSIS — N920 Excessive and frequent menstruation with regular cycle: Secondary | ICD-10-CM

## 2023-08-06 NOTE — Progress Notes (Signed)
Subjective:      Patient ID: Rachael Chambers is a 27 y.o. female.    Chief Complaint:  Chief Complaint   Patient presents with    Follow-up    Contraception       HPI:  HPI  27 y/o female presents today for iud follow up.  Has been spotting since placement.  Cramping once a week sometimes more then once a week.  Cramps lasts a few minutes.  Denies abdominal pain.  Started period yesterday , flow is moderate.    Problem List:  Problem List[1]    Current Medications:  Current Medications[2]    Allergies:  Allergies[3]    Past Medical History:  Medical History[4]    Past Surgical History:  Past Surgical History:   Procedure Laterality Date    WISDOM TOOTH EXTRACTION         Family History:  Family History   Problem Relation Age of Onset    Diabetes Mother     Hypertension Mother     Lung cancer Father     Brain cancer Father     Heart disease Neg Hx     Stroke Neg Hx        Social History:  Social History[5]     The following sections were reviewed this encounter by the provider:   Tobacco  Allergies  Meds  Problems  Med Hx  Surg Hx  Fam Hx         ROS:  Review of Systems    Vitals:  BP 122/72   Pulse 76   Temp 98.1 F (36.7 C)   Resp 12   Wt 100.8 kg (222 lb 3.2 oz)   BMI 37.55 kg/m      Objective:     Physical Exam:  Physical Exam  Constitutional:       Appearance: Normal appearance.   Cardiovascular:      Rate and Rhythm: Regular rhythm.      Heart sounds: Normal heart sounds. No murmur heard.  Pulmonary:      Breath sounds: Normal breath sounds. No wheezing.   Abdominal:      Palpations: There is no mass.      Tenderness: There is no abdominal tenderness.   Genitourinary:     General: Normal vulva.      Exam position: Lithotomy position.      Labia:         Right: No tenderness or lesion.         Left: No tenderness or lesion.       Urethra: No urethral swelling or urethral lesion.      Vagina: Normal.      Cervix: Cervical bleeding present. No cervical motion tenderness, lesion (string seen) or  erythema.      Uterus: Normal. Not enlarged and not tender.       Adnexa: Right adnexa normal and left adnexa normal.        Right: No mass.          Left: No mass.     Neurological:      Mental Status: She is alert.          Assessment:     1. IUD (intrauterine device) in place    2. Dysmenorrhea    3. Menorrhagia with regular cycle      Plan:     Discussed how to check IUD once a month  Continue current care    Discussed can give  6-9 months to see how periods will behave, usually get less    Satira Mccallum, MD        [1]   Patient Active Problem List  Diagnosis    Anemia    Hyperglycemia    Hypokalemia    Personal history of COVID-19    Obesity    Allergic rhinitis    Menorrhagia with regular cycle    Dysmenorrhea    IUD (intrauterine device) in place   [2]   Current Outpatient Medications   Medication Sig Dispense Refill    ferrous sulfate 325 (65 FE) MG tablet Take 1 tablet (325 mg) by mouth      Lactobacillus Acid-Pectin (Acidophilus/Pectin) Cap Take 1 capsule by mouth      Probiotic Product (PROBIOTIC PO) Take by mouth       Current Facility-Administered Medications   Medication Dose Route Frequency Provider Last Rate Last Admin    levonorgestrel (MIRENA) 20 MCG/DAY IUD 52 mg  1 each Intrauterine Once Lonna Rabold, MD       [3]   Allergies  Allergen Reactions    Pollen Extract     Red Dye Nausea Only and Nausea And Vomiting   [4]   Past Medical History:  Diagnosis Date    Acute pyelonephritis 11/09/2020    Sepsis due to Gram negative bacteria 11/09/2020   [5]   Social History  Socioeconomic History    Marital status: Single   Occupational History    Occupation: full  time   Tobacco Use    Smoking status: Never    Smokeless tobacco: Never   Vaping Use    Vaping status: Never Used   Substance and Sexual Activity    Alcohol use: Yes     Comment: "1-3 glasses on occasions"    Drug use: Never    Sexual activity: Yes     Partners: Male     Birth control/protection: Condom   Social History Narrative    Live with mom and step  dad        No siblings        Full time , move around    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 from 2 pm to midnight        Exercise:  Walk , strength 3xwk        Stress: up and down, mid        Gyn hx: last in 2019     Social Determinants of Health     Financial Resource Strain: Low Risk  (11/09/2020)    Received from [employer], [facility]    Overall Financial Resource Strain (CARDIA)     Difficulty of Paying Living Expenses: Not very hard   Stress: No Stress Concern Present (11/09/2020)    Received from [employer], [facility]    [employer] of Occupational Health - Occupational Stress Questionnaire     Feeling of Stress : Not at all    Received from [employer], [facility]    Social Network    Received from [employer], [facility]    HITS

## 2024-03-17 ENCOUNTER — Encounter (INDEPENDENT_AMBULATORY_CARE_PROVIDER_SITE_OTHER): Payer: Self-pay | Admitting: Family Medicine

## 2024-03-17 NOTE — Progress Notes (Signed)
 Pls advise pt if partner feeling iud and causing soreness, this is not normal and iud may have come out of place. It may be removed and replaced with another one if she wants to continue with mirena .  If she wants another mirena , pls check with Julienne we have mirena  and can plan iud replacement.30 min ov    If she just wants it out only, can make ov for that as well.  Triage and help make appt soon. Let me know if issues

## 2024-03-18 NOTE — Progress Notes (Signed)
 PAA lvm on mobile on file to contact office back to assist scheduling.

## 2024-03-18 NOTE — Progress Notes (Signed)
 Pls help give 30 min VV fine consult on 03/23/2024 to decide options

## 2024-03-18 NOTE — Progress Notes (Signed)
 Spoke with patient  . Advised to avoid intercourse if possible. If not able not have intercourse patient to use condoms for protection .  Patient verbalized understanding.

## 2024-03-18 NOTE — Progress Notes (Signed)
 Patient called back and scheduled vv for 03/23/2024. Patient would like to know to do in the mean time until her appointment.

## 2024-03-23 ENCOUNTER — Encounter (INDEPENDENT_AMBULATORY_CARE_PROVIDER_SITE_OTHER): Payer: Self-pay | Admitting: Family Medicine

## 2024-03-23 ENCOUNTER — Telehealth (INDEPENDENT_AMBULATORY_CARE_PROVIDER_SITE_OTHER): Admitting: Family Medicine

## 2024-03-23 VITALS — Wt 193.0 lb

## 2024-03-23 DIAGNOSIS — N920 Excessive and frequent menstruation with regular cycle: Secondary | ICD-10-CM

## 2024-03-23 DIAGNOSIS — N946 Dysmenorrhea, unspecified: Secondary | ICD-10-CM

## 2024-03-23 DIAGNOSIS — E66812 Obesity, class 2: Secondary | ICD-10-CM

## 2024-03-23 DIAGNOSIS — Z975 Presence of (intrauterine) contraceptive device: Secondary | ICD-10-CM

## 2024-03-23 DIAGNOSIS — E6609 Other obesity due to excess calories: Secondary | ICD-10-CM

## 2024-03-23 DIAGNOSIS — Z6837 Body mass index (BMI) 37.0-37.9, adult: Secondary | ICD-10-CM

## 2024-03-23 NOTE — Progress Notes (Signed)
 [facility] - [place]                       Date of Virtual Visit: 03/23/2024 3:04 PM        Patient ID: Rachael Chambers is a 28 y.o. female.  Attending Physician: Talbert LAMY, MD       Telemedicine Eligibility:    State Location:  [x]  [place]   []  [place]   []  [employer] [place] []  [place]   []  Other:    Physical Location:  [x]  Home  []         []        []          []  Other:    Patient Identity Verification:  [x]  State Issued ID  []  Insurance Eligibility Check  []  Other:    Physical Address Verification: (for 911)  [x]  Yes  []  No    Personal identity shared with patient:  [x]  Yes  []  No    Education on nature of video visit shared with patient:  [x]  Yes  []  No    Emergency plan agreed upon with patient:  [x]  Yes  []  No    If the patient had not had this virtual visit, what would they have done?  []         []         []        []          []  Other:    Visit terminated since not appropriate for virtual care:  [x]  N/A  []  Reason:         Chief Complaint:    Chief Complaint   Patient presents with    Contraception               HPI:    HPI  28 y/o female presents today for video visit.  Verbal consent given to bill insurance.  Patient currently in the state of [place].    Patient partner has been feeling part of iud with intercourse.  Felt 2 wks ago   Last Saturday 2 days ago had intercourse, no discomfort  Patient denies pain .  Wants to discuss replacing iud.    Was spotting    Had 2 weeks of period  Always spotting or bleeding    Wt Readings from Last 7 Encounters:   03/23/24 87.5 kg (193 lb)   08/06/23 100.8 kg (222 lb 3.2 oz)   06/20/23 99.8 kg (220 lb)   05/06/23 99.8 kg (220 lb)   03/26/23 99.4 kg (219 lb 3.2 oz)   02/02/21 91.2 kg (201 lb)   01/23/21 93 kg (205 lb)        BMI Readings from Last 7 Encounters:   03/23/24 32.62 kg/m   08/06/23 37.55 kg/m   06/20/23 37.18 kg/m   05/06/23 37.18 kg/m   03/26/23 37.04 kg/m   02/02/21 33.45 kg/m   01/23/21 34.11 kg/m      History of  Present Illness  Rachael Chambers, a patient with an intrauterine device (IUD) placed in June 2024, presents for a consult regarding her partner's ability to feel the IUD during intercourse. She denies any associated pain or discomfort. She also reports ongoing spotting and bleeding since the IUD placement, which has been a persistent issue. The patient notes that the spotting has been more frequent than heavy periods, and she has been experiencing this almost continuously, with only about a week's break in between episodes. Despite  this, she reports an improvement in her previously severe cramps since the IUD placement.     The patient also mentions a significant weight loss achievement, with her BMI decreasing from 37 to 32.           Problem List:    Problem List[1]          Current Meds:    Medications Taking[2]       Allergies:    Allergies[3]          Past Surgical History:    Past Surgical History[4]        Family History:    Family History[5]        Social History:    Social History[6]               Vital Signs:    Wt 87.5 kg (193 lb)   BMI 32.62 kg/m          ROS:    Review of Systems           Physical Exam:    Physical Exam   GENERAL APPEARANCE: alert, in no acute distress, pleasant, well nourished.   HEAD: normal appearance  EYES: no discharge  EARS: normal hearing  NECK/THYROID: appearance -supple  PSYCH: appropriate affect, appropriate mood, normal speech, normal attention        Assessment:    1. Dysmenorrhea    2. Menorrhagia with regular cycle    3. IUD (intrauterine device) in place    4. Class 2 obesity due to excess calories without serious comorbidity with body mass index (BMI) of 37.0 to 37.9 in adult            Plan:      Assessment & Plan  IUD-related spotting and partner discomfort  Rachael Chambers had a Mirena  IUD placed in June 2024 for menorrhagia and dysmenorrhea. She reports persistent spotting since placement, with recent improvement. Her partner experienced discomfort during intercourse two  weeks ago, but there was no discomfort during intercourse two days ago. She denies any pain or discomfort when sitting or during intercourse. The IUD is likely not displaced, as there is no consistent discomfort or pain. The spotting is expected to improve over time, especially given her menorrhagia. Mirena  is optimal due to its high progesterone content, which alleviates menorrhagia and dysmenorrhea. Alternatives like ParaGard could exacerbate bleeding.  - Monitor IUD placement and symptoms  - Check IUD placement during wellness physical in April  - Encourage continued use of Mirena  IUD for contraception and management of menorrhagia and dysmenorrhea    Obesity  Rachael Chambers's BMI has decreased from 37 to 32, indicating significant weight loss. Continued weight loss is crucial for improving menstrual symptoms and overall health. The goal is to reduce her weight to below 180 pounds to exit the obesity category, which can also positively affect her menstrual cycle.  - Encourage continued weight loss efforts  - Set a goal to reduce weight to below 180 pounds  - Monitor weight weekly           Follow-up:    No follow-ups on file.         Talbert LAMY, MD                     [1]   Patient Active Problem List  Diagnosis    Anemia    Hyperglycemia    Hypokalemia    Personal history of COVID-19    Obesity  Allergic rhinitis    Menorrhagia with regular cycle    Dysmenorrhea    IUD (intrauterine device) in place   [2]   No outpatient medications have been marked as taking for the 03/23/24 encounter (Telemedicine Visit) with Christobal Borrow, MD.     Current Facility-Administered Medications for the 03/23/24 encounter (Telemedicine Visit) with Christobal Borrow, MD   Medication Dose Route Frequency Provider Last Rate Last Admin    levonorgestrel  (MIRENA ) 20 MCG/DAY IUD 52 mg  1 each Intrauterine Once Leyah Bocchino, MD       [3]   Allergies  Allergen Reactions    Pollen Extract     Red Dye #40 (Allura Red) Nausea Only and Nausea And Vomiting   [4]   Past  Surgical History:  Procedure Laterality Date    WISDOM TOOTH EXTRACTION     [5]   Family History  Problem Relation Name Age of Onset    Diabetes Mother      Hypertension Mother      Lung cancer Father      Brain cancer Father      Heart disease Neg Hx      Stroke Neg Hx     [6]   Social History  Socioeconomic History    Marital status: Single   Occupational History    Occupation: full  time   Tobacco Use    Smoking status: Never    Smokeless tobacco: Never   Vaping Use    Vaping status: Never Used   Substance and Sexual Activity    Alcohol use: Yes     Comment: 1-3 glasses on occasions    Drug use: Never    Sexual activity: Yes     Partners: Male     Birth control/protection: Condom   Social History Narrative    Live with mom and step dad        No siblings        Full time , move around    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 from 2 pm to midnight        Exercise:  Walk , strength 3xwk        Stress: up and down, mid        Gyn hx: last in 2019     Social Drivers of Health     Financial Resource Strain: Low Risk  (11/09/2020)    Received from [facility]    Overall Financial Resource Strain (CARDIA)     Difficulty of Paying Living Expenses: Not very hard   Stress: No Stress Concern Present (11/09/2020)    Received from [facility] of Occupational Health - Occupational Stress Questionnaire     Feeling of Stress : Not at all    Received from [employer]    Social Network    Received from [facility]    HITS

## 2024-04-07 ENCOUNTER — Encounter (INDEPENDENT_AMBULATORY_CARE_PROVIDER_SITE_OTHER): Payer: Self-pay | Admitting: Family Medicine

## 2024-04-07 ENCOUNTER — Ambulatory Visit (INDEPENDENT_AMBULATORY_CARE_PROVIDER_SITE_OTHER): Payer: BC Managed Care – PPO | Admitting: Family Medicine

## 2024-04-07 VITALS — BP 113/63 | HR 92 | Temp 97.8°F | Resp 14 | Ht 64.0 in | Wt 195.0 lb

## 2024-04-07 DIAGNOSIS — N920 Excessive and frequent menstruation with regular cycle: Secondary | ICD-10-CM

## 2024-04-07 DIAGNOSIS — D500 Iron deficiency anemia secondary to blood loss (chronic): Secondary | ICD-10-CM

## 2024-04-07 DIAGNOSIS — Z6837 Body mass index (BMI) 37.0-37.9, adult: Secondary | ICD-10-CM

## 2024-04-07 DIAGNOSIS — N926 Irregular menstruation, unspecified: Secondary | ICD-10-CM

## 2024-04-07 DIAGNOSIS — Z975 Presence of (intrauterine) contraceptive device: Secondary | ICD-10-CM

## 2024-04-07 DIAGNOSIS — E66812 Obesity, class 2: Secondary | ICD-10-CM

## 2024-04-07 DIAGNOSIS — Z0001 Encounter for general adult medical examination with abnormal findings: Secondary | ICD-10-CM

## 2024-04-07 DIAGNOSIS — N946 Dysmenorrhea, unspecified: Secondary | ICD-10-CM

## 2024-04-07 DIAGNOSIS — Z832 Family history of diseases of the blood and blood-forming organs and certain disorders involving the immune mechanism: Secondary | ICD-10-CM

## 2024-04-07 DIAGNOSIS — E6609 Other obesity due to excess calories: Secondary | ICD-10-CM

## 2024-04-07 LAB — LIPID PANEL
Cholesterol / HDL Ratio: 3.9 {index}
Cholesterol: 189 mg/dL (ref <=199)
HDL: 49 mg/dL (ref >=40)
LDL Calculated: 130 mg/dL — ABNORMAL HIGH (ref 0–99)
Triglycerides: 50 mg/dL (ref 34–149)
VLDL Calculated: 10 mg/dL (ref 10–40)

## 2024-04-07 LAB — IRON PROFILE
Iron Saturation: 23 % (ref 15–50)
Iron: 76 ug/dL (ref 32–157)
TIBC: 324 ug/dL (ref 265–497)
UIBC: 248 ug/dL (ref 126–382)

## 2024-04-07 LAB — LAB USE ONLY - CBC WITH DIFFERENTIAL
Absolute Basophils: 0.01 10*3/uL (ref 0.00–0.08)
Absolute Eosinophils: 0.03 10*3/uL (ref 0.00–0.44)
Absolute Immature Granulocytes: 0.01 10*3/uL (ref 0.00–0.07)
Absolute Lymphocytes: 1.36 10*3/uL (ref 0.42–3.22)
Absolute Monocytes: 0.26 10*3/uL (ref 0.21–0.85)
Absolute Neutrophils: 1.13 10*3/uL (ref 1.10–6.33)
Absolute nRBC: 0 10*3/uL (ref <=0.00)
Basophils %: 0.4 %
Eosinophils %: 1.1 %
Hematocrit: 43.2 % (ref 34.7–43.7)
Hemoglobin: 13.3 g/dL (ref 11.4–14.8)
Immature Granulocytes %: 0.4 %
Lymphocytes %: 48.6 %
MCH: 28.2 pg (ref 25.1–33.5)
MCHC: 30.8 g/dL — ABNORMAL LOW (ref 31.5–35.8)
MCV: 91.5 fL (ref 78.0–96.0)
MPV: 11.7 fL (ref 8.9–12.5)
Monocytes %: 9.3 %
Neutrophils %: 40.2 %
Platelet Count: 173 10*3/uL (ref 142–346)
Preliminary Absolute Neutrophil Count: 1.13 10*3/uL (ref 1.10–6.33)
RBC: 4.72 10*6/uL (ref 3.90–5.10)
RDW: 12 % (ref 11–15)
WBC: 2.8 10*3/uL — ABNORMAL LOW (ref 3.10–9.50)
nRBC %: 0 /100{WBCs} (ref <=0.0)

## 2024-04-07 LAB — HEMOGLOBIN A1C
Average Estimated Glucose: 102.5 mg/dL
Hemoglobin A1C: 5.2 % (ref 4.6–5.6)

## 2024-04-07 LAB — COMPREHENSIVE METABOLIC PANEL
ALT: 12 U/L (ref <=55)
AST (SGOT): 21 U/L (ref <=41)
Albumin/Globulin Ratio: 1.2 (ref 0.9–2.2)
Albumin: 4.3 g/dL (ref 3.5–5.0)
Alkaline Phosphatase: 34 U/L — ABNORMAL LOW (ref 37–117)
Anion Gap: 8 (ref 5.0–15.0)
BUN: 10 mg/dL (ref 7–21)
Bilirubin, Total: 0.5 mg/dL (ref 0.2–1.2)
CO2: 23 meq/L (ref 17–29)
Calcium: 9.1 mg/dL (ref 8.5–10.5)
Chloride: 110 meq/L (ref 99–111)
Creatinine: 0.8 mg/dL (ref 0.4–1.0)
GFR: >60 mL/min/{1.73_m2} (ref >=60.0)
Globulin: 3.6 g/dL (ref 2.0–3.6)
Glucose: 91 mg/dL (ref 70–100)
Hemolysis Index: 3 {index}
Potassium: 4.8 meq/L (ref 3.5–5.3)
Protein, Total: 7.9 g/dL (ref 6.0–8.3)
Sodium: 141 meq/L (ref 135–145)

## 2024-04-07 LAB — SICKLE CELL SCREEN: Sickle Screen Test: NEGATIVE

## 2024-04-07 LAB — TSH: TSH: 1.32 u[IU]/mL (ref 0.35–4.94)

## 2024-04-07 LAB — FERRITIN: Ferritin: 64.9 ng/mL (ref 4.60–204.00)

## 2024-04-07 LAB — VITAMIN D, 25 OH, TOTAL: Vitamin D 25-OH, Total: 13 ng/mL — ABNORMAL LOW (ref 30–100)

## 2024-04-07 MED ORDER — NORETHINDRONE ACETATE 5 MG PO TABS
5.0000 mg | ORAL_TABLET | Freq: Every day | ORAL | 2 refills | Status: AC
Start: 2024-04-07 — End: ?

## 2024-04-07 NOTE — Progress Notes (Signed)
 [facility]                         Date of Exam: 04/07/2024 12:28 PM        Patient ID: Rachael Chambers is a 28 y.o. female.  Attending Physician: Armandina Bernard, MD        Chief Complaint:    Wellness exam            HPI:      Visit Type: Health Maintenance Visit  Work Status: working full-time  Reported Health: good health  Reported Diet: moderate compliance with recommended diet  Reported Exercise: 3-4x/week  Dental: regular dental visits twice a year  Vision: glasses  Hearing: normal hearing  Immunization Status: immunizations up to date  Reproductive Health: 04/04/24  Prior Screening Tests: pap smear within past 3-5 years  General Health Risks: family history of breast cancer  Safety Elements Used: uses seat belts, smoke detectors in household, carbon monoxide detectors in household, sunscreen use, does not text and drive, and no guns at home  PHQ2 Score:    1. How often do you have a drink containing alcohol?  (1) Monthly or less    2. How many drinks containing alcohol do you have on a typical day when you are drinking?  (0) 1-2    3. How often do you have six or more drinks on one occasion?  (0) Never    Score: Sum of 1 + 2 + 3 =     Scoring:     Total Score Risk Level Intervention Suggested   Men Women     0-3 0-2 Minimal Risk Brief Education   4-5 3-4 Low Risk  Review Drinking Behavior  Complete AUDIT  Brief Education and Brief Intervention   6-9 5-8 Moderate Risk Review Drinking Behavior   Complete AUDIT  Provide Resources (pamphlet or website)   Follow Up appointment   10-12 9-12 High Risk Review Drinking Behavior  Complete AUDIT  Brief Education and Counseilng  Provide Resources (Pamphlet or website)  Follow up to review use  Send to Specialist     On period started 2 days, medium/light  Prior was 2 days off  Wt Readings from Last 7 Encounters:   04/07/24 88.5 kg (195 lb)   03/23/24 87.5 kg (193 lb)   08/06/23 100.8 kg (222 lb 3.2 oz)   06/20/23 99.8 kg (220 lb)   05/06/23 99.8 kg  (220 lb)   03/26/23 99.4 kg (219 lb 3.2 oz)   02/02/21 91.2 kg (201 lb)        BMI Readings from Last 7 Encounters:   04/07/24 33.47 kg/m   03/23/24 32.62 kg/m   08/06/23 37.55 kg/m   06/20/23 37.18 kg/m   05/06/23 37.18 kg/m   03/26/23 37.04 kg/m   02/02/21 33.45 kg/m      History of Present Illness  Rachael Chambers is a 28 year old female who presents for an annual physical exam.    She is sexually active and uses an IUD for contraception. Her last Pap smear was three years ago and was normal. She has not received a COVID booster yet due to side effects from the last booster.    She consumes alcohol occasionally, about once a week, with one to three glasses each time. She does not smoke or use recreational drugs. She works full-time and is single, living with her mother and stepfather.  Her exercise routine has decreased from five times a week to three times a week, with sessions lasting 30 to 40 minutes. She aims to increase it back to five times a week. She is working on weight loss, follows a low-carb diet with adequate protein intake, and monitors her weight weekly. She experienced a setback during a recent certification exam.    She feels good overall with no pain or shortness of breath during walking. Bowel movements and urination are normal. She takes one multivitamin daily and has no history of other procedures besides wisdom teeth removal.    Her family history includes diabetes in her mother and lung cancer, but no history of stroke or heart disease. She is concerned about the sickle cell trait as her mother and boyfriend both have it, although she has never been told she has it.     Beyond wellness:  Irregular periods bothering her with mirena           Problem List:    Problem List[1]          Current Meds:    Medications Taking[2]       Allergies:    Allergies[3]          Past Surgical History:    Past Surgical History[4]        Family History:    Family History[5]        Social History:     Social History[6]        The following sections were reviewed this encounter by the provider:   Tobacco  Allergies  Meds  Problems  Med Hx  Surg Hx  Fam Hx             Vital Signs:    BP 113/63 (BP Site: Right arm, Patient Position: Sitting, Cuff Size: Large)   Pulse 92   Temp 97.8 F (36.6 C) (Tympanic)   Resp 14   Ht 1.626 m (5\' 4" )   Wt 88.5 kg (195 lb)   LMP 04/04/2024 (Approximate)   BMI 33.47 kg/m          ROS:    Review of Systems   General/Constitutional:   Denies Fatigue.   Ophthalmologic:   Denies Blurred vision  ENT: feeling fine  Endocrine:   Denies Weakness, normal  Respiratory:    Denies Shortness of breath. Denies cough  Cardiovascular:   Denies Chest pain.   Gastrointestinal:   Denies Abdominal pain. Bowels are moving fine.   Hematology: Normal  Genitourinary:   Denies urinary problems.  Musculoskeletal:   Moving well  Skin:   Denies Change in Mole(s). Denies Rash.   Neurologic: Normal   Psychiatric:   Denies Anxiety. Denies Depressed mood. Denies Difficulty sleeping.           Physical Exam:    Physical Exam   GENERAL APPEARANCE: alert, in no acute distress, well developed, well nourished,    HEAD: normal appearance, atraumatic.   EYES: extraocular movement intact (EOMI), pupils equal, round, reactive to light and accommodation, sclera anicteric, conjunctiva clear.   EARS: tympanic membranes normal bilaterally, external canals normal .   NOSE: normal nasal mucosa, no lesions.   ORAL CAVITY: normal oropharynx, normal lips, mucosa moist, no lesions.   THROAT: normal appearance, clear, no erythema.   NECK/THYROID: neck supple, no cervical lymphadenopathy, no neck mass palpated, no jugular venous distention, no thyromegaly.   LYMPH NODES: no palpable adenopathy.   SKIN: good turgor, no rashes, no suspicious lesions.  HEART: S1, S2 normal, no murmurs, rubs, gallops, regular rate and rhythm.   LUNGS: normal effort / no distress, normal breath sounds, clear to auscultation bilaterally, no  wheezes, rales, rhonchi.   BREASTS: no discharge, no masses palpable bilaterally, nontender.   ABDOMEN: bowel sounds present, no hepatosplenomegaly, soft, nontender, nondistended.     MUSCULOSKELETAL: full range of motion, no swelling or deformity.   EXTREMITIES: no edema, no clubbing, cyanosis, or edema.   PERIPHERAL PULSES:normal  NEUROLOGIC: nonfocal, cranial nerves 2-12 grossly intact, symmetrical, normal strength, tone and reflexes, sensory exam intact. No facial asymmetry  PSYCH: cognitive function intact, mood/affect normal, speech clear. Memory intact. Normal thought process.        Assessment:    1. Encounter for routine adult health examination with abnormal findings  - CBC with Differential (Order); Future  - Comprehensive Metabolic Panel; Future  - TSH; Future  - Lipid Panel; Future  - Hemoglobin A1C; Future  - CBC with Differential (Order)  - Comprehensive Metabolic Panel  - TSH  - Lipid Panel  - Hemoglobin A1C    2. IUD (intrauterine device) in place  - Follow Up In Primary Care    3. Dysmenorrhea    4. Menorrhagia with regular cycle    5. Family history of sickle cell trait in mother  - Sickle Cell Screen; Future  - Sickle Cell Screen    6. Class 2 obesity due to excess calories without serious comorbidity with body mass index (BMI) of 37.0 to 37.9 in adult  - Vitamin D , 25 OH, Total; Future  - Vitamin D , 25 OH, Total    7. Iron deficiency anemia due to chronic blood loss  - Iron Profile; Future  - Ferritin; Future  - Iron Profile  - Ferritin    8. Irregular menstrual bleeding  - norethindrone  (AYGESTIN ) 5 MG tablet; Take 1 tablet (5 mg) by mouth daily  Dispense: 30 tablet; Refill: 2            Plan:    Lifestyle discussed: Healthy diet rich in fruits and vegetables, whole grains and lean meats, scarce in sugars, carbs, processed foods.   Exercise 30 minutes at least five days a week. (total of 150 minutes per week).    Wear Sunscreen in the sun, helmets on every bike ride and seat belts for every car  ride.   Age appropriate screening tests discussed and recommended    Health Maintenance Reminders:  PREVENTION  Exercise  Aerobic exercise(walking, bike riding, swimming, runnin, aerobics) has been proven to reduce stress, help with sleep and reducethe risk of heart disease, diabetes, stroke, hyertension, cancer (specifically colon and breast cancer)and greatly reduce overall mortality. Experts recommend at least 30 minutes of aerobic exercise most days or 150 minutes of exercise per week. In addition, two sessions per week of weight -bearing exercise(strength or resistance exercises)can reduce the risk of osteoporosis(brittle bones) and improve strength and fitness. Now SITTING IS THE NEW SMOKING. So if you have a sedentary job, you may need to exercise up to one hour a day to make up for it.    Smoking Cessation  If you use tobacco, either smoking or dipping/chewing, vaping, quitting would significantly reduce your risk of heart disease, stroke, hypertension, chronic bronchitis, emphysema, asthma and cancer(especially lung, throat, bladder and cervical). Smoking while pregnant can cause complications such as low birth weight. Children in smoking households are at a higher risk of Sudden Infant Death Syndrome(SIDS). We can offer information, advice, techniques and  medications if you are interested in quitting.     Healthy Diet  Scientific studies have repeatedly shown the importance of a  Healthy diet in providing protection from heart disease, stroke, hypertension and many cancers. Reduction in red meats and fatty foods can help control cholesterol. Five to nine servings per day of fruits and vegetables have been shown to reduce the risk of heart disease and certain cancers including colon, breast and prostate. Diets high in natural fiber have been associated with lower rates of heart disease and colon cancer. Eating fish high in Omega 3 fatty acids(salmon, tuna) may reduce risk of heart disease. A healthy  diet  is essential in any successful weight loss program.     Alcohol  Alcohol is a toxin, a cancer causing agent. It is best not to drink at all. If you drink, alcohol should be used in moderation. Men should have no more than two drinks per day and women no more than one drink per day. Many studies have shown that drinking more than two drinks per day on a regular basis can increase risk of cancer and liver disease. Drinking alcohol while pregnant can cause fetal alcohol syndrome in infants, which could result in a physical or neurological problem.    Calcium and Vitamin D   Calcium is essential to develop and maintain strong bones. Current studies recommend calcium is best taken in foods. Take calcium pill supplements only if you do not get from foods since they can have side effects too. Vitamin D  is needed to absorb calcium. Adults need at least 1000 iu of Vitamin D  daily. Since Vitamin D  is not available in foods easily, it can be taken as Vitamin D3 supplement. Also it can be formed in your body by 20 minutes of sun exposure  to 20% of your skin every day.  The following foods each contain about 300 mg of calcium:  2 oz. Cheese  8 oz. Milk or yoghurt  1 cup cooked broccoli, spinach, collards or kale    1/4 cup or a handful of almonds has 80 mg of calcium    The following amounts of calcium are recommended:  1000 mg per day for men and pre-menopausal women  1200 mg per day for pregnant and nursing women  1200 mg per day for post-menopausal women and those with osteoporosis      Folic Acid  [phone] mcg(micrograms)daily of folic acid is recommended for all women of childbearing age to reduce risk of birth defects.    Multivitamin  I recommend one [place]  made multivitamin daily as long as tolerated. Best way to get vitamins is rainbow color of vegetables and fruit. So a multivitamin should not replace a healthy diet, rather just augment it.    Eye Exam  Examination of eyes by a professional should be done every one to two  years. If you wear glasses or contact lenses see optometrist every year.  Glaucoma screening is done every 2 years starting at age 34.    Dental Exam  Preventive dental exam should be done every six months.    CANCER SCREENING TESTS:  Breast Cancer: Mammograms are recommended every one to two years after age 79. Women between 67 and 48 should discuss the need for routine mammogram with their physician.    Cervical Cancer:Pap smears should be done on a regular basis in women who still have a uterus and are between the ages of 65 and 5. After hysterectomy, paps are not needed  if there is no cervix. According to current guidelines, if feeling fine, routine pelvic exams are not indicated.    Colon Cancer: Everyone between the ages of 31 and 93 should have periodic screening tests for colon cancer. Following methods are available:          Colonoscopy every 10 years is the gold standard        FIT test (stool exam) every year        Cologard(stool exam) every 3 years    Skin Cancer:Risk of skin cancer may be reduced by use of sun screen(at least SPF 30)when outdoors, using hats or clothing to  Minimize sun exposure to the skin, avoiding tanning booths and avoiding sun exposure during peak sun times(11 AM to 3 PM).This will also prevent premature aging and wrinkling of the skin.   Monitor moles and check your skin once a month looking for the following features, using formula ABCD:        A: asymmetry of lesions, moles        B:irregular borders, blood vessels in moles        C:different colors in one mole        D:diameter increasing, changing, itching and bothering. Specially if 6 mm or larger.    Prostate Cancer: Ask your doctor/provider about the possible risks and benefits of screening for prostate cancer. Guidelines have changed. It can be discussed starting at age 46 for men with average risk. It can be discussed starting at age 28 for men at high risk and  who have one first degree relative with prostate cancer  at an early age. It can be discussed at  age 84 for men of African American descent and men who have a first-degree relative(father or brother) diagnosed with prostate cancer at an early age(younger than age 71).    SAFETY:  Seat Belts should be used by everyone in the car. Children under 12 are safest in the back seat. Age appropriate car seats should be installed and used consistently. Car seats are required for all children under age six. Seats should be rear facing for infants less than 20 lbs and may face forward for those between 20-40 lbs. Booster seats should be used until a child is over 4 feet 9 inches tall.    Smoke Detectors should be on all floors of the house, have battery backup if connected to the electrical system and should have the batteries replaced every six months( when you change the clocks in Spring and Fall).    Helmets and other protective gear should be used for biking, tricycling, skateboarding and roller blading. Helmets and full protective clothing should always be worn while riding motorcycles.    Guns should be stored in a secure locked location, separate from the ammunition. They are best stored unloaded and with safety locks.    Immunizations should be checked and updated regularly. Ask your doctor if you have any questions.    Assessment & Plan  Obesity  She is managing obesity with regular exercise and a low-carb diet, monitoring weight weekly.  - Encourage exercise five times a week for 30-40 minutes per session.  - Continue low-carb diet with adequate protein and vegetables.  - Monitor weight weekly.  - Order vitamin D  level.    Sickle Cell Trait Screening  Screening warranted due to strong family history despite normal blood work.  - Add sickle cell trait screening to blood work.    General Health Maintenance  She is due for a Pap smear and COVID booster. Advised on skin, breast monitoring, and reducing alcohol.  - Schedule Pap smear.  - Recommend COVID booster.  - Encourage  monthly skin self-exams.  - Advise awareness of breast changes.  - Continue dental check-ups every six months and eye exams yearly.  - Advise reducing alcohol consumption to zero.       Today patient is seen for wellness visit. Today meds,issues were addressed.  Pt is made aware that chronic issues and meds were addressed and it will be billed apart from wellness visit to the insurance. There may be  related copays  per insurance that patient is responsible for.     Irregular bleeding"  Try progesterone pill 5 mg dailyf for 21 days start now and give 1 week for period  And can restart, repeat for 2-3 months    Will follow up for pap          Follow-up:    No follow-ups on file.         Armandina Bernard, MD                      [1]   Patient Active Problem List  Diagnosis    Anemia    Hyperglycemia    Hypokalemia    Personal history of COVID-19    Obesity    Allergic rhinitis    Menorrhagia with regular cycle    Dysmenorrhea    IUD (intrauterine device) in place   [2]   No outpatient medications have been marked as taking for the 04/07/24 encounter (Office Visit) with Armandina Bernard, MD.     Current Facility-Administered Medications for the 04/07/24 encounter (Office Visit) with Armandina Bernard, MD   Medication Dose Route Frequency Provider Last Rate Last Admin    levonorgestrel  (MIRENA ) 20 MCG/DAY IUD 52 mg  1 each Intrauterine Once Marijke Guadiana, MD       [3]   Allergies  Allergen Reactions    Pollen Extract     Red Dye #40 (Allura Red) Nausea Only and Nausea And Vomiting   [4]   Past Surgical History:  Procedure Laterality Date    WISDOM TOOTH EXTRACTION     [5]   Family History  Problem Relation Name Age of Onset    Diabetes Mother      Hypertension Mother      Lung cancer Father      Brain cancer Father      Heart disease Neg Hx      Stroke Neg Hx     [6]   Social History  Tobacco Use    Smoking status: Never    Smokeless tobacco: Never   Vaping Use    Vaping status: Never Used   Substance Use Topics    Alcohol use: Yes     Comment:  once a week 1-3 glasses    Drug use: Never

## 2024-04-09 ENCOUNTER — Ambulatory Visit (INDEPENDENT_AMBULATORY_CARE_PROVIDER_SITE_OTHER): Payer: Self-pay | Admitting: Family Medicine

## 2024-05-21 ENCOUNTER — Ambulatory Visit (INDEPENDENT_AMBULATORY_CARE_PROVIDER_SITE_OTHER): Admitting: Family Medicine

## 2024-05-21 ENCOUNTER — Ambulatory Visit: Attending: Family Medicine | Admitting: Family Medicine

## 2024-05-21 ENCOUNTER — Encounter (INDEPENDENT_AMBULATORY_CARE_PROVIDER_SITE_OTHER): Payer: Self-pay | Admitting: Family Medicine

## 2024-05-21 VITALS — BP 110/70 | HR 76 | Resp 14 | Wt 195.2 lb

## 2024-05-21 DIAGNOSIS — N926 Irregular menstruation, unspecified: Secondary | ICD-10-CM

## 2024-05-21 DIAGNOSIS — Z6837 Body mass index (BMI) 37.0-37.9, adult: Secondary | ICD-10-CM

## 2024-05-21 DIAGNOSIS — E559 Vitamin D deficiency, unspecified: Secondary | ICD-10-CM | POA: Insufficient documentation

## 2024-05-21 DIAGNOSIS — Z975 Presence of (intrauterine) contraceptive device: Secondary | ICD-10-CM

## 2024-05-21 DIAGNOSIS — E66812 Obesity, class 2: Secondary | ICD-10-CM

## 2024-05-21 DIAGNOSIS — Z124 Encounter for screening for malignant neoplasm of cervix: Secondary | ICD-10-CM

## 2024-05-21 DIAGNOSIS — E6609 Other obesity due to excess calories: Secondary | ICD-10-CM

## 2024-05-21 NOTE — Progress Notes (Signed)
 Subjective:      Patient ID: Rachael Chambers is a 28 y.o. female.    Chief Complaint:  Chief Complaint   Patient presents with    Gynecologic Exam     pap       HPI:  28 y/o female presents today for pap .  Has spotting with mirena .  Last period ended yesterday.    History of Present Illness  Wt Readings from Last 7 Encounters:   05/21/24 88.5 kg (195 lb 3.2 oz)   04/07/24 88.5 kg (195 lb)   03/23/24 87.5 kg (193 lb)   08/06/23 100.8 kg (222 lb 3.2 oz)   06/20/23 99.8 kg (220 lb)   05/06/23 99.8 kg (220 lb)   03/26/23 99.4 kg (219 lb 3.2 oz)        BMI Readings from Last 7 Encounters:   05/21/24 33.51 kg/m   04/07/24 33.47 kg/m   03/23/24 32.62 kg/m   08/06/23 37.55 kg/m   06/20/23 37.18 kg/m   05/06/23 37.18 kg/m   03/26/23 37.04 kg/m          Rachael Chambers is a 28 year old female who presents for a Pap smear and follow-up on vitamin D  deficiency and irregular periods.    She has a vitamin D  deficiency with a level of 13 ng/mL. She started taking vitamin D  supplements at a dose of 2000 IU daily, which was increased to 5000 IU daily due to the low level. She has not noticed any significant changes in her energy levels since starting the supplement.    She experiences irregular periods and has been using hormone pills to manage this. The pills have not significantly improved her symptoms, as she continues to experience spotting and irregular bleeding. She has been using the pills for 21 days on and 7 days off, but the first month did not show much difference. She is considering increasing the dosage to 10 mg to see if it helps with the spotting and irregular bleeding.    She is sexually active and reports no discomfort or pain during intercourse. She has been using an IUD, which has helped with heavy periods and pain, but she continues to experience irregular periods. She is working on weight loss, which might help regulate her periods.       Results:  Results  Procedure: Pap smear  Description: Speculum  inserted into the vagina. Cervix exposed. Blood wiped away. String observed, all normal. Cells collected from the cervix. Minimal bleeding noted.    LABS  Vitamin D : 13       Problem List:  Problem List[1]    Current Medications:  Current Medications[2]    Allergies:  Allergies[3]    Past Medical History:  Medical History[4]    Past Surgical History:  Past Surgical History[5]    Family History:  Family History[6]    Social History:  Social History[7]        ROS:  Review of Systems    Vitals:  BP 110/70   Pulse 76   Resp 14   Wt 88.5 kg (195 lb 3.2 oz)   BMI 33.51 kg/m      Objective:     Physical Exam:  Physical Exam  Constitutional:       Appearance: Normal appearance.   Cardiovascular:      Rate and Rhythm: Regular rhythm.      Heart sounds: Normal heart sounds. No murmur heard.  Pulmonary:  Breath sounds: Normal breath sounds. No wheezing.   Abdominal:      Palpations: There is no mass.      Tenderness: There is no abdominal tenderness.   Genitourinary:     General: Normal vulva.      Exam position: Lithotomy position.      Labia:         Right: No tenderness or lesion.         Left: No tenderness or lesion.       Urethra: No urethral swelling or urethral lesion.      Vagina: Normal.      Cervix: No cervical motion tenderness, lesion or erythema.      Uterus: Normal. Not enlarged and not tender.       Adnexa: Right adnexa normal and left adnexa normal.        Right: No mass.          Left: No mass.        Comments: String seen  Neurological:      Mental Status: She is alert.       Physical Exam  GENITOURINARY: Cervix normal. Vagina normal.        Assessment:     1. IUD (intrauterine device) in place    2. Cervical cancer screening  - Gyn Cytology/Pap Smear (Order); Future  - Gyn Cytology/Pap Smear (Order)    3. Vitamin D  deficiency    4. Irregular menstrual bleeding    5. Class 2 obesity due to excess calories without serious comorbidity with body mass index (BMI) of 37.0 to 37.9 in adult      Plan:      Assessment & Plan  Irregular menstruation  Irregular menstruation with spotting persists despite Provera. Discussed potential weight gain with progesterone. Considered increasing Provera to 10 mg if spotting continues. IUD benefits include reduced pain and anemia, but irregular periods may persist. Weight loss may help regulate periods.  - Increase Provera to 10 mg for 7-10 days if spotting continues.  - Consider discontinuing Provera if desired, as it can be resumed anytime.  - Consider referral to GYN for a second opinion if symptoms persist.    Vitamin D  deficiency  Vitamin D  level critically low at 13 ng/mL. Current intake insufficient. Recommended increase to 5000 IU daily. Target level above 30 ng/mL.  - Increase vitamin D  intake to 5000 IU daily.  - Recheck vitamin D  levels in one year.       Talbert LAMY, MD           [1]   Patient Active Problem List  Diagnosis    Anemia    Hyperglycemia    Hypokalemia    Personal history of COVID-19    Obesity    Allergic rhinitis    Menorrhagia with regular cycle    Dysmenorrhea    IUD (intrauterine device) in place    Vitamin D  deficiency   [2]   Current Outpatient Medications   Medication Sig Dispense Refill    multivitamin with minerals (vitamins/minerals) tablet Take 1 tablet by mouth once daily      vitamin D  (CHOLECALCIFEROL) 25 MCG (1000 UT) tablet Take 2 tablets (2,000 Units) by mouth once daily       Current Facility-Administered Medications   Medication Dose Route Frequency Provider Last Rate Last Admin    levonorgestrel  (MIRENA ) 20 MCG/DAY IUD 52 mg  1 each Intrauterine Once Matai Carpenito, MD       [3]  Allergies  Allergen Reactions    Pollen Extract     Red Dye #40 (Allura Red) Nausea Only and Nausea And Vomiting   [4]   Past Medical History:  Diagnosis Date    Acute pyelonephritis 11/09/2020    Sepsis due to Gram negative bacteria (CMS/HCC) 11/09/2020   [5]   Past Surgical History:  Procedure Laterality Date    WISDOM TOOTH EXTRACTION     [6]   Family  History  Problem Relation Name Age of Onset    Diabetes Mother      Hypertension Mother      Lung cancer Father      Brain cancer Father      Heart disease Neg Hx      Stroke Neg Hx     [7]   Social History  Socioeconomic History    Marital status: Single   Occupational History    Occupation: full  time   Tobacco Use    Smoking status: Never    Smokeless tobacco: Never   Vaping Use    Vaping status: Never Used   Substance and Sexual Activity    Alcohol use: Yes     Comment: once a week 1-3 glasses    Drug use: Never    Sexual activity: Yes     Partners: Male     Birth control/protection: I.U.D.   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 from 2 pm to midnight        Exercise:  Walk , strength 3xwk        Stress: up and down, mid        Gyn hx: last in 2019    Last pap in 2022        Nutrition: has allowed food list     Social Drivers of Health     Financial Resource Strain: Low Risk  (11/09/2020)    Received from [facility]    Overall Financial Resource Strain (CARDIA)     Difficulty of Paying Living Expenses: Not very hard   Stress: No Stress Concern Present (11/09/2020)    Received from [facility] - Occupational Stress Questionnaire     Feeling of Stress : Not at all    Received from [employer]    Social Network    Received from [facility]    HITS

## 2024-05-25 LAB — LAB USE ONLY- GYN CYTOLOGY/PAP SMEAR

## 2024-05-26 ENCOUNTER — Ambulatory Visit (INDEPENDENT_AMBULATORY_CARE_PROVIDER_SITE_OTHER): Payer: Self-pay | Admitting: Family Medicine

## 2024-05-29 ENCOUNTER — Encounter (INDEPENDENT_AMBULATORY_CARE_PROVIDER_SITE_OTHER): Payer: Self-pay | Admitting: Family Medicine

## 2024-07-03 ENCOUNTER — Telehealth (INDEPENDENT_AMBULATORY_CARE_PROVIDER_SITE_OTHER): Payer: Self-pay | Admitting: Family Medicine

## 2024-07-03 DIAGNOSIS — N926 Irregular menstruation, unspecified: Secondary | ICD-10-CM

## 2024-07-03 NOTE — Telephone Encounter (Addendum)
 Pt called in requesting for birth control medication(pt doesn't remember name of medication) Pt stated she needs to take it today      Please advise    Call back number    [phone]

## 2024-07-06 NOTE — Telephone Encounter (Signed)
 Called and spoke to patient and she states she still has the IUD in and that her provider had advised her to also take birth control. Pt reported  she has been in non- estrogen birth control and would like to continue on the same type. Pt was informed Dr. Christobal is not in the office until next week and she fine waiting for her doctor to come back and to advise.

## 2024-07-13 MED ORDER — NORETHINDRONE ACETATE 5 MG PO TABS
5.0000 mg | ORAL_TABLET | Freq: Every day | ORAL | 5 refills | Status: AC
Start: 2024-07-13 — End: ?

## 2024-07-13 NOTE — Telephone Encounter (Signed)
 Pls advise progesterone to control period with IUD was sent thank you

## 2024-07-14 NOTE — Telephone Encounter (Signed)
 Lmom

## 2024-10-14 ENCOUNTER — Other Ambulatory Visit (INDEPENDENT_AMBULATORY_CARE_PROVIDER_SITE_OTHER): Payer: Self-pay | Admitting: Family Medicine
